# Patient Record
Sex: Female | Born: 1950
Health system: Southern US, Community
[De-identification: ages and names within clinical notes are randomized; demographics above are authoritative.]

## PROBLEM LIST (undated history)

## (undated) DIAGNOSIS — E119 Type 2 diabetes mellitus without complications: Secondary | ICD-10-CM

---

## 1998-06-05 ENCOUNTER — Emergency Department (HOSPITAL_COMMUNITY): Admission: EM | Admit: 1998-06-05 | Discharge: 1998-06-05 | Payer: Self-pay | Admitting: Emergency Medicine

## 1998-06-05 ENCOUNTER — Encounter: Payer: Self-pay | Admitting: Emergency Medicine

## 1998-06-06 ENCOUNTER — Encounter: Payer: Self-pay | Admitting: Emergency Medicine

## 1998-08-22 ENCOUNTER — Other Ambulatory Visit: Admission: RE | Admit: 1998-08-22 | Discharge: 1998-08-22 | Payer: Self-pay | Admitting: Obstetrics and Gynecology

## 1999-06-27 ENCOUNTER — Emergency Department (HOSPITAL_COMMUNITY): Admission: EM | Admit: 1999-06-27 | Discharge: 1999-06-27 | Payer: Self-pay

## 1999-10-03 ENCOUNTER — Other Ambulatory Visit: Admission: RE | Admit: 1999-10-03 | Discharge: 1999-10-03 | Payer: Self-pay | Admitting: Obstetrics and Gynecology

## 2000-12-29 ENCOUNTER — Other Ambulatory Visit: Admission: RE | Admit: 2000-12-29 | Discharge: 2000-12-29 | Payer: Self-pay | Admitting: Obstetrics and Gynecology

## 2001-11-10 ENCOUNTER — Other Ambulatory Visit: Admission: RE | Admit: 2001-11-10 | Discharge: 2001-11-10 | Payer: Self-pay | Admitting: Obstetrics and Gynecology

## 2001-11-19 ENCOUNTER — Encounter: Admission: RE | Admit: 2001-11-19 | Discharge: 2001-11-19 | Payer: Self-pay | Admitting: Obstetrics and Gynecology

## 2001-11-19 ENCOUNTER — Encounter: Payer: Self-pay | Admitting: Obstetrics and Gynecology

## 2002-05-07 ENCOUNTER — Inpatient Hospital Stay (HOSPITAL_COMMUNITY): Admission: AD | Admit: 2002-05-07 | Discharge: 2002-05-07 | Payer: Self-pay | Admitting: Obstetrics and Gynecology

## 2002-06-02 ENCOUNTER — Encounter: Admission: RE | Admit: 2002-06-02 | Discharge: 2002-08-31 | Payer: Self-pay | Admitting: Internal Medicine

## 2002-11-08 ENCOUNTER — Encounter: Admission: RE | Admit: 2002-11-08 | Discharge: 2002-11-08 | Payer: Self-pay | Admitting: Internal Medicine

## 2002-11-08 ENCOUNTER — Encounter: Payer: Self-pay | Admitting: Internal Medicine

## 2005-07-02 ENCOUNTER — Other Ambulatory Visit: Admission: RE | Admit: 2005-07-02 | Discharge: 2005-07-02 | Payer: Self-pay | Admitting: Obstetrics and Gynecology

## 2011-02-10 ENCOUNTER — Other Ambulatory Visit (HOSPITAL_COMMUNITY)
Admission: RE | Admit: 2011-02-10 | Discharge: 2011-02-10 | Disposition: A | Discharge status: Home / Self Care | Payer: Self-pay | Source: Ambulatory Visit | Attending: *Deleted | Admitting: *Deleted

## 2011-02-10 ENCOUNTER — Other Ambulatory Visit: Payer: Self-pay | Admitting: *Deleted

## 2011-02-10 DIAGNOSIS — Z01419 Encounter for gynecological examination (general) (routine) without abnormal findings: Secondary | ICD-10-CM | POA: Insufficient documentation

## 2011-02-10 DIAGNOSIS — Z1159 Encounter for screening for other viral diseases: Secondary | ICD-10-CM | POA: Insufficient documentation

## 2012-12-01 ENCOUNTER — Other Ambulatory Visit (HOSPITAL_COMMUNITY)
Admission: RE | Admit: 2012-12-01 | Discharge: 2012-12-01 | Disposition: A | Discharge status: Home / Self Care | Payer: BC Managed Care – PPO | Source: Ambulatory Visit | Attending: Obstetrics and Gynecology | Admitting: Obstetrics and Gynecology

## 2012-12-01 ENCOUNTER — Other Ambulatory Visit: Payer: Self-pay | Admitting: Obstetrics and Gynecology

## 2012-12-01 DIAGNOSIS — Z01419 Encounter for gynecological examination (general) (routine) without abnormal findings: Secondary | ICD-10-CM | POA: Insufficient documentation

## 2014-04-07 ENCOUNTER — Other Ambulatory Visit (HOSPITAL_COMMUNITY)
Admission: RE | Admit: 2014-04-07 | Discharge: 2014-04-07 | Disposition: A | Discharge status: Home / Self Care | Payer: BC Managed Care – PPO | Source: Ambulatory Visit | Attending: Obstetrics and Gynecology | Admitting: Obstetrics and Gynecology

## 2014-04-07 ENCOUNTER — Other Ambulatory Visit: Payer: Self-pay | Admitting: Obstetrics and Gynecology

## 2014-04-07 DIAGNOSIS — Z1151 Encounter for screening for human papillomavirus (HPV): Secondary | ICD-10-CM | POA: Insufficient documentation

## 2014-04-07 DIAGNOSIS — Z01419 Encounter for gynecological examination (general) (routine) without abnormal findings: Secondary | ICD-10-CM | POA: Insufficient documentation

## 2014-04-07 DIAGNOSIS — Z1231 Encounter for screening mammogram for malignant neoplasm of breast: Secondary | ICD-10-CM

## 2014-04-10 LAB — CYTOLOGY - PAP

## 2014-04-13 ENCOUNTER — Ambulatory Visit: Payer: BC Managed Care – PPO

## 2014-05-21 ENCOUNTER — Emergency Department (HOSPITAL_COMMUNITY): Payer: Medicare PPO

## 2014-05-21 ENCOUNTER — Emergency Department (HOSPITAL_COMMUNITY)
Admission: EM | Admit: 2014-05-21 | Discharge: 2014-05-21 | Disposition: A | Discharge status: Home / Self Care | Payer: Medicare PPO | Attending: Emergency Medicine | Admitting: Emergency Medicine

## 2014-05-21 ENCOUNTER — Encounter (HOSPITAL_COMMUNITY): Payer: Self-pay | Admitting: *Deleted

## 2014-05-21 DIAGNOSIS — Z79899 Other long term (current) drug therapy: Secondary | ICD-10-CM | POA: Diagnosis not present

## 2014-05-21 DIAGNOSIS — M546 Pain in thoracic spine: Secondary | ICD-10-CM | POA: Insufficient documentation

## 2014-05-21 DIAGNOSIS — E119 Type 2 diabetes mellitus without complications: Secondary | ICD-10-CM | POA: Diagnosis not present

## 2014-05-21 DIAGNOSIS — K5900 Constipation, unspecified: Secondary | ICD-10-CM | POA: Diagnosis not present

## 2014-05-21 DIAGNOSIS — Z7982 Long term (current) use of aspirin: Secondary | ICD-10-CM | POA: Diagnosis not present

## 2014-05-21 DIAGNOSIS — Z720 Tobacco use: Secondary | ICD-10-CM | POA: Insufficient documentation

## 2014-05-21 HISTORY — DX: Type 2 diabetes mellitus without complications: E11.9

## 2014-05-21 LAB — COMPREHENSIVE METABOLIC PANEL
ALBUMIN: 4.1 g/dL (ref 3.5–5.2)
ALK PHOS: 71 U/L (ref 39–117)
ALT: 26 U/L (ref 0–35)
ANION GAP: 8 (ref 5–15)
AST: 22 U/L (ref 0–37)
BILIRUBIN TOTAL: 0.6 mg/dL (ref 0.3–1.2)
BUN: 8 mg/dL (ref 6–23)
CO2: 26 mmol/L (ref 19–32)
Calcium: 9.6 mg/dL (ref 8.4–10.5)
Chloride: 109 mEq/L (ref 96–112)
Creatinine, Ser: 0.71 mg/dL (ref 0.50–1.10)
GFR, EST NON AFRICAN AMERICAN: 89 mL/min — AB (ref >90)
Glucose, Bld: 117 mg/dL — ABNORMAL HIGH (ref 70–99)
POTASSIUM: 4.5 mmol/L (ref 3.5–5.1)
Sodium: 143 mmol/L (ref 135–145)
TOTAL PROTEIN: 6.9 g/dL (ref 6.0–8.3)

## 2014-05-21 LAB — URINALYSIS, ROUTINE W REFLEX MICROSCOPIC
Bilirubin Urine: NEGATIVE
GLUCOSE, UA: NEGATIVE mg/dL
HGB URINE DIPSTICK: NEGATIVE
Ketones, ur: NEGATIVE mg/dL
LEUKOCYTES UA: NEGATIVE
Nitrite: NEGATIVE
PROTEIN: NEGATIVE mg/dL
SPECIFIC GRAVITY, URINE: 1.022 (ref 1.005–1.030)
Urobilinogen, UA: 1 mg/dL (ref 0.0–1.0)
pH: 6 (ref 5.0–8.0)

## 2014-05-21 LAB — I-STAT TROPONIN, ED: TROPONIN I, POC: 0 ng/mL (ref 0.00–0.08)

## 2014-05-21 LAB — LIPASE, BLOOD: Lipase: 23 U/L (ref 11–59)

## 2014-05-21 LAB — CBC WITH DIFFERENTIAL/PLATELET
BASOS ABS: 0 10*3/uL (ref 0.0–0.1)
BASOS PCT: 0 % (ref 0–1)
EOS ABS: 0.1 10*3/uL (ref 0.0–0.7)
EOS PCT: 1 % (ref 0–5)
HCT: 39 % (ref 36.0–46.0)
HEMOGLOBIN: 13 g/dL (ref 12.0–15.0)
LYMPHS ABS: 2 10*3/uL (ref 0.7–4.0)
Lymphocytes Relative: 22 % (ref 12–46)
MCH: 29.6 pg (ref 26.0–34.0)
MCHC: 33.3 g/dL (ref 30.0–36.0)
MCV: 88.8 fL (ref 78.0–100.0)
Monocytes Absolute: 0.4 10*3/uL (ref 0.1–1.0)
Monocytes Relative: 4 % (ref 3–12)
Neutro Abs: 6.6 10*3/uL (ref 1.7–7.7)
Neutrophils Relative %: 73 % (ref 43–77)
Platelets: 238 10*3/uL (ref 150–400)
RBC: 4.39 MIL/uL (ref 3.87–5.11)
RDW: 12.7 % (ref 11.5–15.5)
WBC: 9 10*3/uL (ref 4.0–10.5)

## 2014-05-21 MED ORDER — CYCLOBENZAPRINE HCL 5 MG PO TABS: 5.0000 mg | ORAL_TABLET | Freq: Three times a day (TID) | ORAL | Status: DC | PRN

## 2014-05-21 MED ORDER — CYCLOBENZAPRINE HCL 10 MG PO TABS
5.0000 mg | ORAL_TABLET | Freq: Once | ORAL | Status: AC
  Administered 2014-05-21: 5 mg via ORAL
  Filled 2014-05-21: qty 1

## 2014-05-21 MED ORDER — ACETAMINOPHEN 325 MG PO TABS
650.0000 mg | ORAL_TABLET | Freq: Once | ORAL | Status: AC
  Administered 2014-05-21: 650 mg via ORAL
  Filled 2014-05-21: qty 2

## 2014-05-21 NOTE — ED Notes (Signed)
Pt st's pain is mostly in right mid to lower back

## 2014-05-21 NOTE — ED Notes (Signed)
Pt reports RUQ pain that radiates around to her back, denies n/v or sob. Denies urinary symptoms, pain increases with movement.

## 2014-05-21 NOTE — ED Provider Notes (Signed)
CSN: [id number]     Arrival date & time 05/21/14  1444 History   First MD Initiated Contact with Patient 05/21/14 1625     Chief Complaint  Patient presents with  . Abdominal Pain  . Back Pain     (Consider location/radiation/quality/duration/timing/severity/associated sxs/prior Treatment) HPI  64 year old female presents with one week of intermittent right upper back pain. Occasionally the pain radiates around to her right upper quadrant. The pain seems to come and go without obvious etiology why. It does worsen when she twists her back or does sudden movements. Inspiration does not increase her pain. No shortness of breath, chest pain, or cough. No current abdominal pain. The patient has not had any dysuria or hematuria. She has not tried anything besides Gas-X for the pain. She has been moderately constipated, no diarrhea recently. The patient has never had pain like this before.  Past Medical History  Diagnosis Date  . Diabetes mellitus without complication    History reviewed. No pertinent past surgical history. History reviewed. No pertinent family history. History  Substance Use Topics  . Smoking status: Current Every Day Smoker    Types: Cigarettes  . Smokeless tobacco: Not on file  . Alcohol Use: No   OB History    No data available     Review of Systems  Constitutional: Negative for fever.  Respiratory: Negative for cough and shortness of breath.   Cardiovascular: Negative for chest pain.  Gastrointestinal: Positive for abdominal pain. Negative for nausea, vomiting and diarrhea.  Genitourinary: Negative for dysuria and hematuria.  Musculoskeletal: Positive for back pain.  Neurological: Negative for weakness and numbness.  All other systems reviewed and are negative.     Allergies  Review of patient's allergies indicates no known allergies.  Home Medications   Prior to Admission medications   Medication Sig Start Date End Date Taking? Authorizing Provider    aspirin EC 81 MG tablet Take 81 mg by mouth every 6 (six) hours as needed (pain).   Yes Historical Provider, MD  metFORMIN (GLUCOPHAGE) 850 MG tablet Take 850 mg by mouth daily.  05/15/14  Yes Historical Provider, MD  valACYclovir (VALTREX) 500 MG tablet Take 500 mg by mouth daily as needed (outbreaks).  04/03/14  Yes Historical Provider, MD   BP 139/63 mmHg  Pulse 62  Temp(Src) 98.1 F (36.7 C) (Oral)  Resp 18  Ht  (1.549 m)  Wt 140 lb (63.504 kg)  BMI 26.47 kg/m2  SpO2 99% Physical Exam  Constitutional: She is oriented to person, place, and time. She appears well-developed and well-nourished. No distress.  HENT:  Head: Normocephalic and atraumatic.  Right Ear: External ear normal.  Left Ear: External ear normal.  Nose: Nose normal.  Eyes: Right eye exhibits no discharge. Left eye exhibits no discharge.  Cardiovascular: Normal rate, regular rhythm and normal heart sounds.   Pulmonary/Chest: Effort normal and breath sounds normal.  Abdominal: Soft. There is no tenderness. There is no rigidity, no guarding and negative Murphy's sign.  Musculoskeletal:       Thoracic back: She exhibits tenderness (tenderness over right CVA).       Back:  Neurological: She is alert and oriented to person, place, and time. She exhibits normal muscle tone. Gait normal.  Equal strength in extremities  Skin: Skin is warm and dry. She is not diaphoretic.  Nursing note and vitals reviewed.   ED Course  Procedures (including critical care time) Labs Review Labs Reviewed  COMPREHENSIVE METABOLIC PANEL -  Abnormal; Notable for the following:    Glucose, Bld 117 (*)    GFR calc non Af Amer 89 (*)    All other components within normal limits  CBC WITH DIFFERENTIAL  LIPASE, BLOOD  URINALYSIS, ROUTINE W REFLEX MICROSCOPIC  I-STAT TROPOININ, ED    Imaging Review Ct Renal Stone Study  05/21/2014   CLINICAL DATA:  Right flank pain. Right upper quadrant abdominal pain. Clinical suspicion for renal  calculi. Diabetes.  EXAM: CT ABDOMEN AND PELVIS WITHOUT CONTRAST  TECHNIQUE: Multidetector CT imaging of the abdomen and pelvis was performed following the standard protocol without IV contrast.  COMPARISON:  None.  FINDINGS: Lower chest:  Mildly elevated left hemidiaphragm.  Hepatobiliary: Upper normal CBD size 7 mm.  Otherwise negative.  Pancreas: Unremarkable  Spleen: Unremarkable  Adrenals/Urinary Tract: Unremarkable  Stomach/Bowel: Borderline wall thickening in the appendix at 7 mm, without significant periappendiceal inflammatory findings. Scattered colonic diverticula.  Vascular/Lymphatic: Minimal atherosclerosis.  Reproductive: Unremarkable  Other: No supplemental non-categorized findings.  Musculoskeletal: Nonspecific 7 mm sclerotic lesion posteriorly in the L3 vertebral body. Left eccentric disc bulge at L4-5.  IMPRESSION: 1. A specific cause for right-sided abdominal pain is not identified. No stones noted. 2. Left eccentric disc bulge at L4-5.  Minimal atherosclerosis. 3. There is borderline appendiceal wall thickening with appendiceal diameter at 7 mm, but without periappendiceal stranding. The appearance is not considered diagnostic for acute appendicitis. 4. Mildly elevated left hemidiaphragm.   Electronically Signed   By: Herbie BaltimoreWalt  Liebkemann M.D.   On: 05/21/2014 17:07     EKG Interpretation   Date/Time:  Sunday May 21 2014 14:52:50 EST Ventricular Rate:  63 PR Interval:  150 QRS Duration: 76 QT Interval:  408 QTC Calculation: 417 R Axis:   62 Text Interpretation:  Normal sinus rhythm with sinus arrhythmia Normal ECG  No old tracing to compare Confirmed by Cecil Vandyke  MD, Ludivina Guymon ([id number]) on  05/21/2014 4:38:52 PM      MDM   Final diagnoses:  Right-sided thoracic back pain    No obvious etiology for the patient's right-sided back pain. It does appear to radiate to the front of her abdomen occasionally but she has no current abdominal pain, normal LFTs, and normal CT scan. At this  point given her point tenderness this is likely a skilled skeletal strain/pain. Will treat with Tylenol, NSAIDs, and Flexeril. She does not want any narcotics at this time. Discussed importance of follow-up with PCP if symptoms do not improve and we discussed strict return precautions.    Audree CamelScott T Dulcinea Kinser, MD 05/21/14 [phone]

## 2014-05-21 NOTE — ED Notes (Signed)
Pt returned from CT °

## 2014-05-21 NOTE — Discharge Instructions (Signed)

## 2015-02-09 IMAGING — CT CT RENAL STONE PROTOCOL
2 of 4 series · 5 of 46 positions shown, 7 images · non-contrast
Comparison: None.

CLINICAL DATA: Right flank pain. Right upper quadrant abdominal
pain. Clinical suspicion for renal calculi. Diabetes.

EXAM:
CT ABDOMEN AND PELVIS WITHOUT CONTRAST
TECHNIQUE: Multidetector CT imaging of the abdomen and pelvis was performed
following the standard protocol without IV contrast.

[Series 204: coronals · coronal · 0.50mm/px · 4 of 107 slices shown, 5 images]
[im 24/107  soft-tissue]
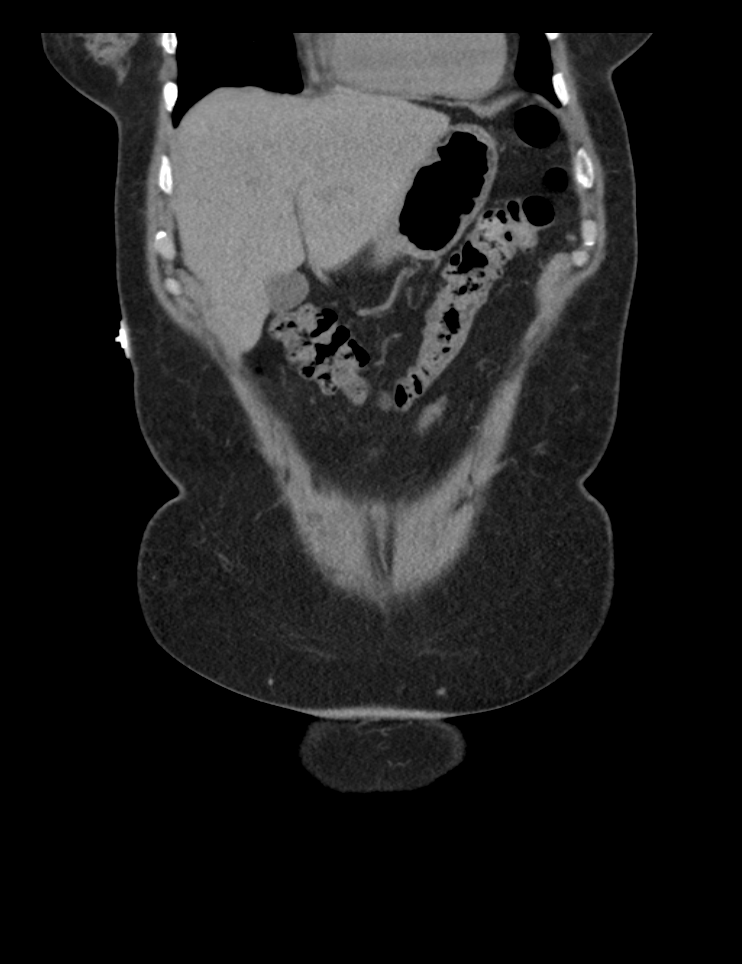
[im 24/107  bone]
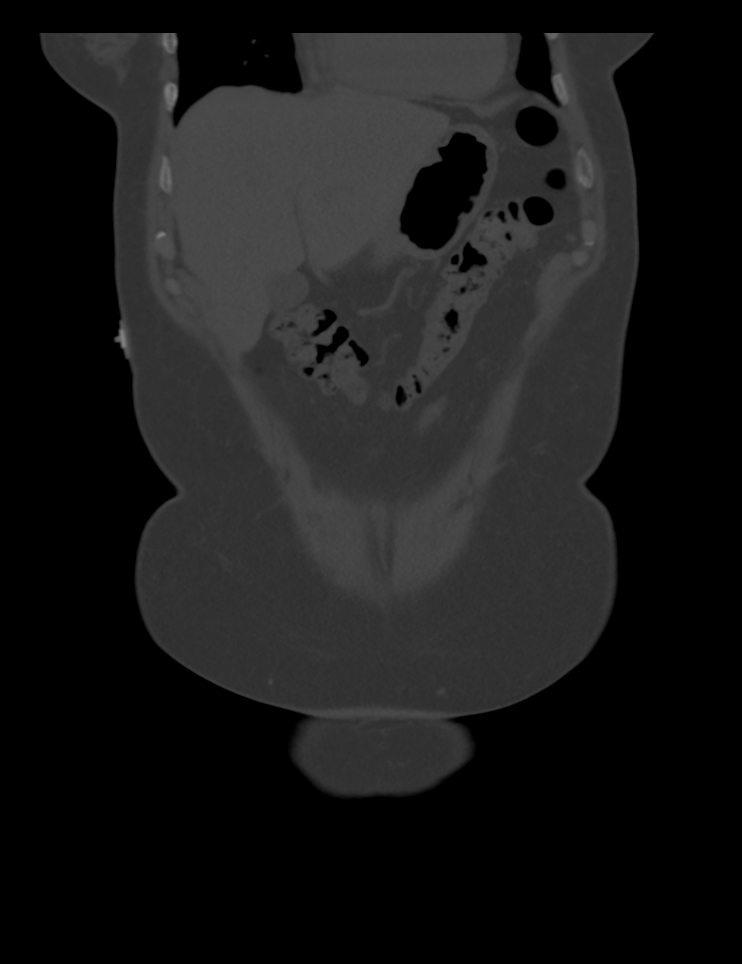
[im 48/107  soft-tissue]
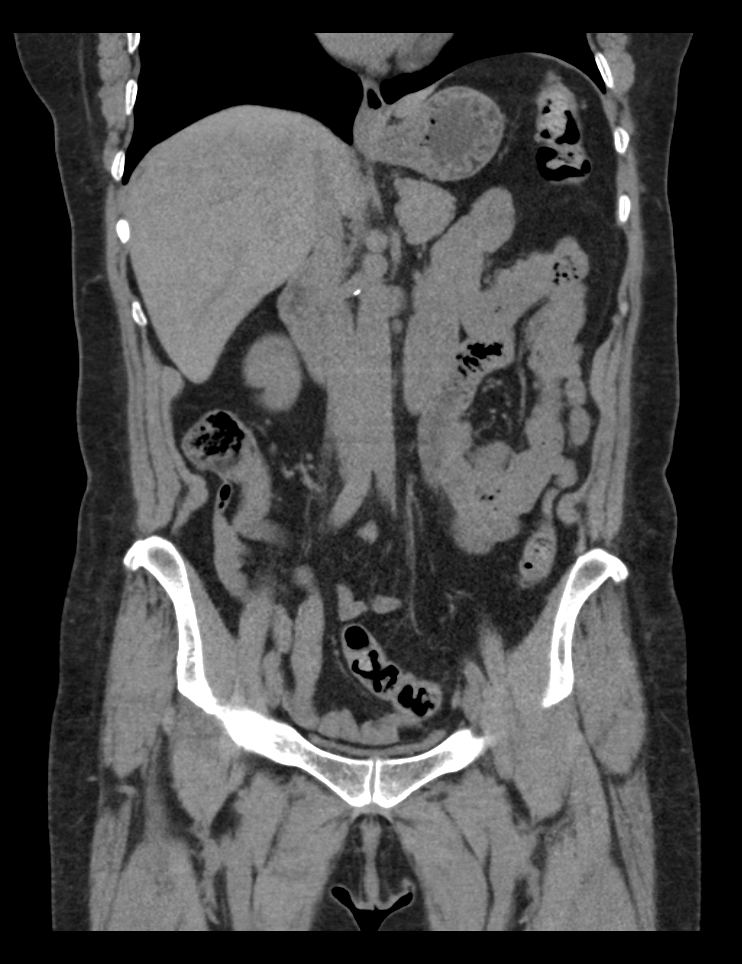
[im 71/107  soft-tissue]
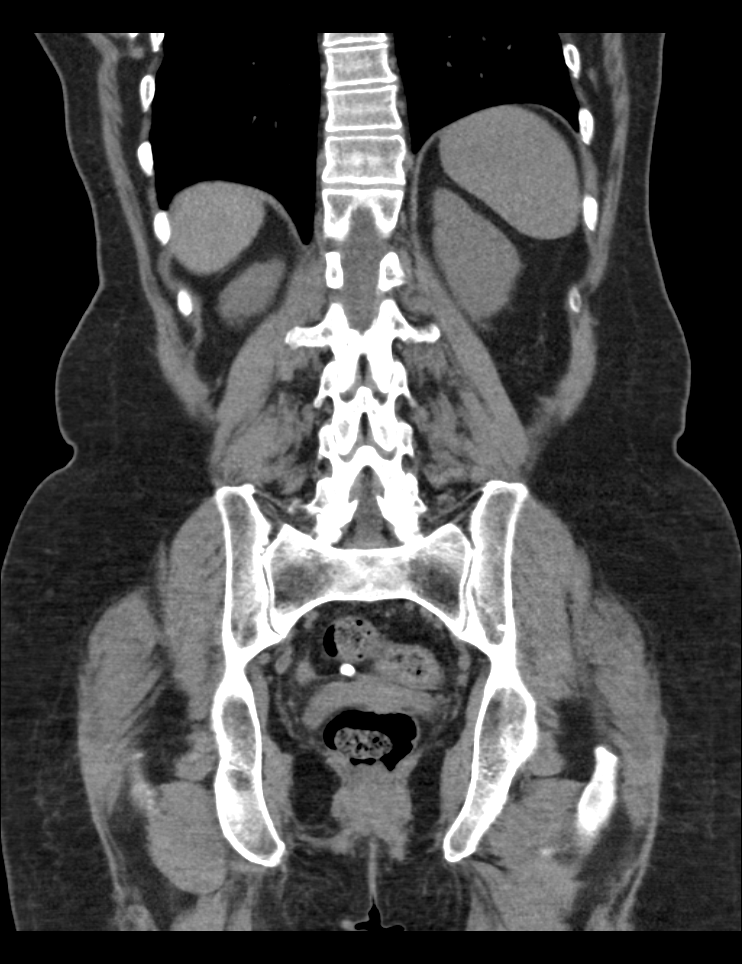
[im 95/107  soft-tissue]
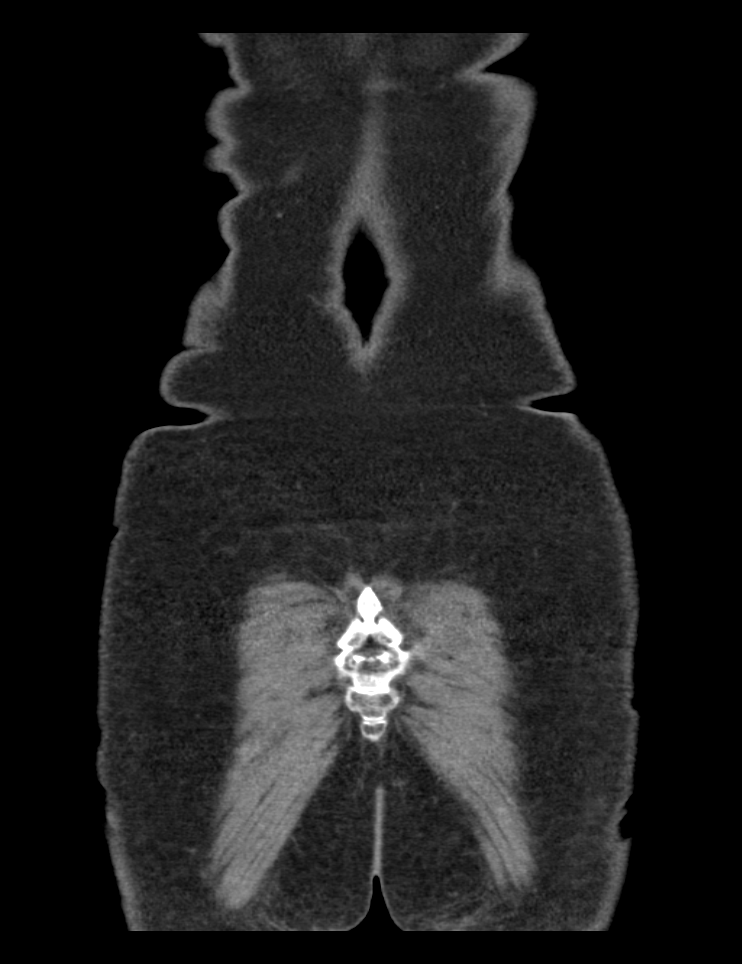

[Series 205: sagittals · sagittal · 0.50mm/px · 1 of 123 slices shown, 2 images]
[im 41/123  soft-tissue]
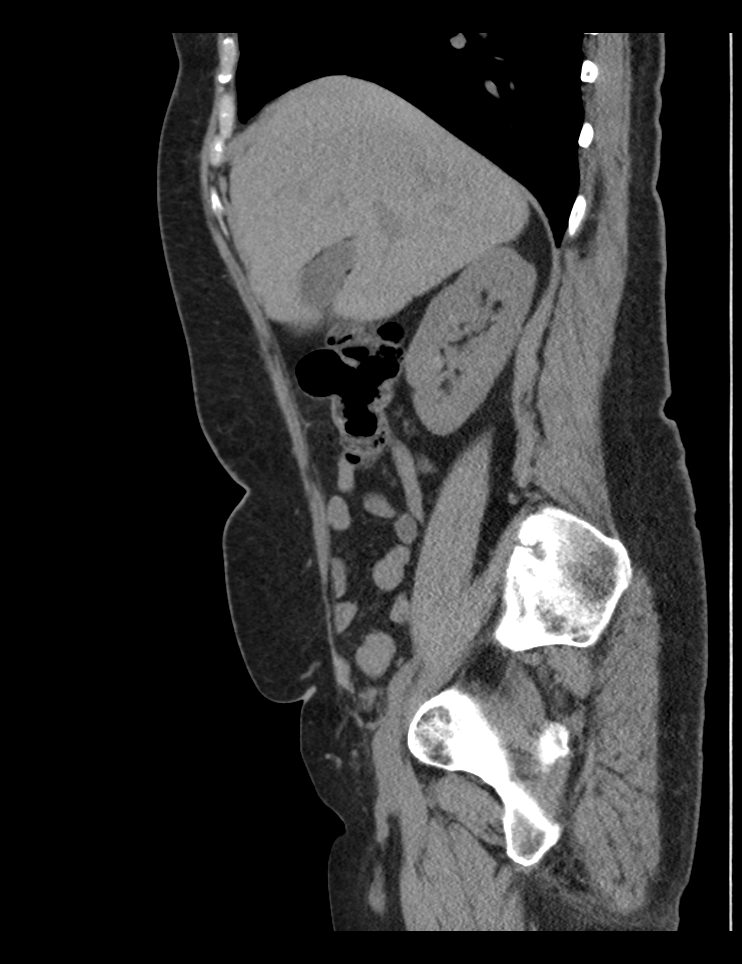
[im 41/123  bone]
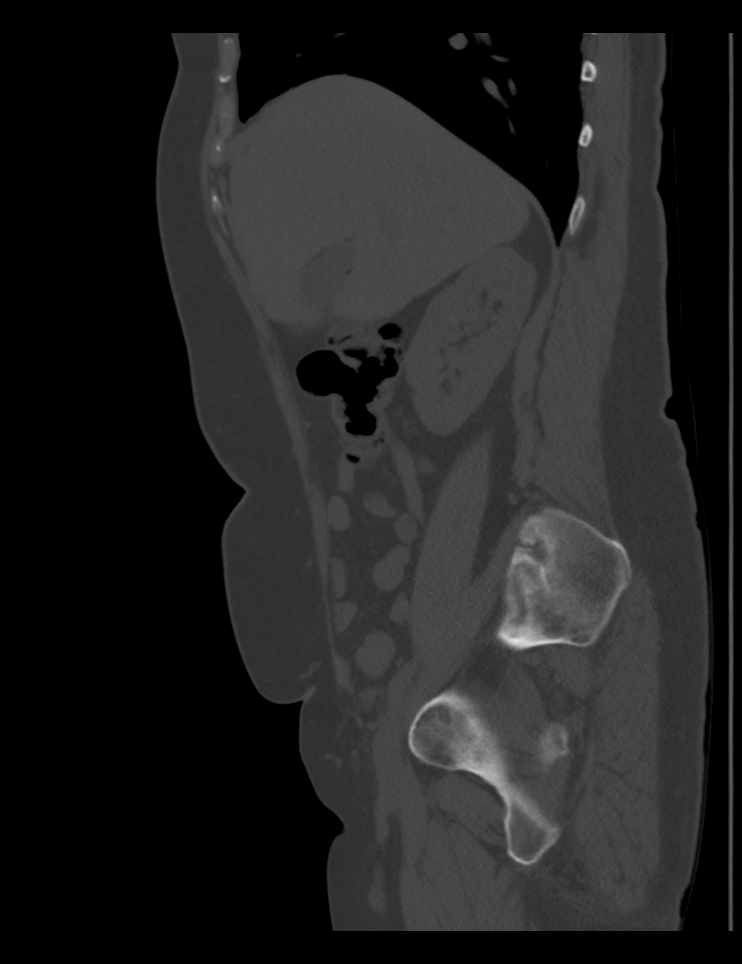

[5 of 46 positions shown; findings below may reference images not displayed]

FINDINGS: Lower chest:  Mildly elevated left hemidiaphragm.

Hepatobiliary: Upper normal CBD size 7 mm.  Otherwise negative.

Pancreas: Unremarkable

Spleen: Unremarkable

Adrenals/Urinary Tract: Unremarkable

Stomach/Bowel: Borderline wall thickening in the appendix at 7 mm,
without significant periappendiceal inflammatory findings. Scattered
colonic diverticula.

Vascular/Lymphatic: Minimal atherosclerosis.

Reproductive: Unremarkable

Other: No supplemental non-categorized findings.

Musculoskeletal: Nonspecific 7 mm sclerotic lesion posteriorly in
the L3 vertebral body. Left eccentric disc bulge at L4-5.
IMPRESSION: 1. A specific cause for right-sided abdominal pain is not
identified. No stones noted.
2. Left eccentric disc bulge at L4-5.  Minimal atherosclerosis.
3. There is borderline appendiceal wall thickening with appendiceal
diameter at 7 mm, but without periappendiceal stranding. The
appearance is not considered diagnostic for acute appendicitis.
4. Mildly elevated left hemidiaphragm.

## 2015-09-24 DIAGNOSIS — F17210 Nicotine dependence, cigarettes, uncomplicated: Secondary | ICD-10-CM | POA: Diagnosis not present

## 2015-09-24 DIAGNOSIS — E119 Type 2 diabetes mellitus without complications: Secondary | ICD-10-CM | POA: Diagnosis not present

## 2015-09-24 DIAGNOSIS — Z7984 Long term (current) use of oral hypoglycemic drugs: Secondary | ICD-10-CM | POA: Diagnosis not present

## 2015-09-24 DIAGNOSIS — E782 Mixed hyperlipidemia: Secondary | ICD-10-CM | POA: Diagnosis not present

## 2015-09-24 DIAGNOSIS — Z1211 Encounter for screening for malignant neoplasm of colon: Secondary | ICD-10-CM | POA: Diagnosis not present

## 2015-09-24 DIAGNOSIS — M549 Dorsalgia, unspecified: Secondary | ICD-10-CM | POA: Diagnosis not present

## 2015-10-17 ENCOUNTER — Other Ambulatory Visit: Payer: Self-pay | Admitting: Internal Medicine

## 2015-10-17 DIAGNOSIS — Z0000 Encounter for general adult medical examination without abnormal findings: Secondary | ICD-10-CM | POA: Diagnosis not present

## 2015-10-17 DIAGNOSIS — F17210 Nicotine dependence, cigarettes, uncomplicated: Secondary | ICD-10-CM | POA: Diagnosis not present

## 2015-10-17 DIAGNOSIS — Z7189 Other specified counseling: Secondary | ICD-10-CM | POA: Diagnosis not present

## 2015-10-17 DIAGNOSIS — Z13820 Encounter for screening for osteoporosis: Secondary | ICD-10-CM | POA: Diagnosis not present

## 2015-10-17 DIAGNOSIS — Z1231 Encounter for screening mammogram for malignant neoplasm of breast: Secondary | ICD-10-CM

## 2015-10-17 DIAGNOSIS — Z1389 Encounter for screening for other disorder: Secondary | ICD-10-CM | POA: Diagnosis not present

## 2015-10-17 DIAGNOSIS — Z716 Tobacco abuse counseling: Secondary | ICD-10-CM | POA: Diagnosis not present

## 2015-10-17 DIAGNOSIS — Z1239 Encounter for other screening for malignant neoplasm of breast: Secondary | ICD-10-CM | POA: Diagnosis not present

## 2015-10-17 DIAGNOSIS — Z23 Encounter for immunization: Secondary | ICD-10-CM | POA: Diagnosis not present

## 2015-11-22 ENCOUNTER — Ambulatory Visit
Admission: RE | Admit: 2015-11-22 | Discharge: 2015-11-22 | Disposition: A | Discharge status: Home / Self Care | Payer: BC Managed Care – PPO | Source: Ambulatory Visit | Attending: Internal Medicine | Admitting: Internal Medicine

## 2015-11-22 DIAGNOSIS — Z1231 Encounter for screening mammogram for malignant neoplasm of breast: Secondary | ICD-10-CM | POA: Diagnosis not present

## 2016-04-03 ENCOUNTER — Other Ambulatory Visit: Payer: Self-pay | Admitting: Internal Medicine

## 2016-04-03 ENCOUNTER — Ambulatory Visit
Admission: RE | Admit: 2016-04-03 | Discharge: 2016-04-03 | Disposition: A | Discharge status: Home / Self Care | Payer: BC Managed Care – PPO | Source: Ambulatory Visit | Attending: Internal Medicine | Admitting: Internal Medicine

## 2016-04-03 DIAGNOSIS — E119 Type 2 diabetes mellitus without complications: Secondary | ICD-10-CM | POA: Diagnosis not present

## 2016-04-03 DIAGNOSIS — Z23 Encounter for immunization: Secondary | ICD-10-CM | POA: Diagnosis not present

## 2016-04-03 DIAGNOSIS — Z7984 Long term (current) use of oral hypoglycemic drugs: Secondary | ICD-10-CM | POA: Diagnosis not present

## 2016-04-03 DIAGNOSIS — M549 Dorsalgia, unspecified: Secondary | ICD-10-CM

## 2016-04-03 DIAGNOSIS — R0781 Pleurodynia: Secondary | ICD-10-CM | POA: Diagnosis not present

## 2018-03-17 ENCOUNTER — Emergency Department (HOSPITAL_COMMUNITY)
Admission: EM | Admit: 2018-03-17 | Discharge: 2018-03-17 | Disposition: A | Discharge status: Home / Self Care | Payer: Medicare Other | Attending: Emergency Medicine | Admitting: Emergency Medicine

## 2018-03-17 DIAGNOSIS — E119 Type 2 diabetes mellitus without complications: Secondary | ICD-10-CM | POA: Diagnosis not present

## 2018-03-17 DIAGNOSIS — Y92410 Unspecified street and highway as the place of occurrence of the external cause: Secondary | ICD-10-CM | POA: Insufficient documentation

## 2018-03-17 DIAGNOSIS — Z7984 Long term (current) use of oral hypoglycemic drugs: Secondary | ICD-10-CM | POA: Diagnosis not present

## 2018-03-17 DIAGNOSIS — M7918 Myalgia, other site: Secondary | ICD-10-CM | POA: Insufficient documentation

## 2018-03-17 DIAGNOSIS — Z7982 Long term (current) use of aspirin: Secondary | ICD-10-CM | POA: Diagnosis not present

## 2018-03-17 DIAGNOSIS — F17210 Nicotine dependence, cigarettes, uncomplicated: Secondary | ICD-10-CM | POA: Insufficient documentation
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
## 2018-03-17 NOTE — ED Notes (Signed)
Patient verbalizes understanding of discharge instructions. Opportunity for questioning and answers were provided. Armband removed by staff, pt discharged from ED ambulatory.   

## 2019-07-15 ENCOUNTER — Ambulatory Visit: Payer: Medicare PPO | Attending: Internal Medicine

## 2019-07-15 DIAGNOSIS — Z23 Encounter for immunization: Secondary | ICD-10-CM

## 2019-07-15 NOTE — Progress Notes (Signed)
   Covid-19 Vaccination Clinic  Name:  Pamela Gonzalez    MRN: [id number] DOB: [birth year]  07/15/2019  [name] was observed post Covid-19 immunization for 15 minutes without incident. She was provided with Vaccine Information Sheet and instruction to access the V-Safe system.   [name] was instructed to call 911 with any severe reactions post vaccine: Marland Kitchen Difficulty breathing  . Swelling of face and throat  . A fast heartbeat  . A bad rash all over body  . Dizziness and weakness   Immunizations Administered    Name Date Dose VIS Date Route   Pfizer COVID-19 Vaccine 07/15/2019 11:02 AM 0.3 mL 04/15/2019 Intramuscular   Manufacturer: [employer], [employer]   Lot: [id number]   NDC: [address]-9

## 2019-07-18 ENCOUNTER — Other Ambulatory Visit: Payer: Self-pay | Admitting: Internal Medicine

## 2019-07-18 DIAGNOSIS — Z1231 Encounter for screening mammogram for malignant neoplasm of breast: Secondary | ICD-10-CM

## 2019-07-18 DIAGNOSIS — E2839 Other primary ovarian failure: Secondary | ICD-10-CM

## 2019-07-25 ENCOUNTER — Other Ambulatory Visit: Payer: Self-pay | Admitting: Internal Medicine

## 2019-07-25 DIAGNOSIS — F17210 Nicotine dependence, cigarettes, uncomplicated: Secondary | ICD-10-CM

## 2019-07-27 ENCOUNTER — Other Ambulatory Visit: Payer: Self-pay | Admitting: Internal Medicine

## 2019-07-27 DIAGNOSIS — F17210 Nicotine dependence, cigarettes, uncomplicated: Secondary | ICD-10-CM

## 2019-08-08 ENCOUNTER — Ambulatory Visit: Payer: Medicare PPO | Attending: Internal Medicine

## 2019-08-08 DIAGNOSIS — Z23 Encounter for immunization: Secondary | ICD-10-CM

## 2019-08-08 NOTE — Progress Notes (Signed)
   Covid-19 Vaccination Clinic  Name:  Pamela Gonzalez    MRN: [id number] DOB: Mar 08, 1951  08/08/2019  [name] was observed post Covid-19 immunization for 15 minutes without incident. She was provided with Vaccine Information Sheet and instruction to access the V-Safe system.   [name] was instructed to call 911 with any severe reactions post vaccine: Marland Kitchen Difficulty breathing  . Swelling of face and throat  . A fast heartbeat  . A bad rash all over body  . Dizziness and weakness   Immunizations Administered    Name Date Dose VIS Date Route   Pfizer COVID-19 Vaccine 08/08/2019 12:25 PM 0.3 mL 04/15/2019 Intramuscular   Manufacturer: [employer], [employer]   Lot: [id number]   NDC: [address]-9

## 2020-07-04 ENCOUNTER — Other Ambulatory Visit: Payer: Self-pay

## 2020-07-04 ENCOUNTER — Ambulatory Visit: Payer: Medicare PPO | Admitting: Podiatry

## 2020-07-04 DIAGNOSIS — M779 Enthesopathy, unspecified: Secondary | ICD-10-CM | POA: Diagnosis not present

## 2020-07-04 DIAGNOSIS — M2041 Other hammer toe(s) (acquired), right foot: Secondary | ICD-10-CM

## 2020-07-04 DIAGNOSIS — L84 Corns and callosities: Secondary | ICD-10-CM | POA: Diagnosis not present

## 2020-07-04 MED ORDER — TRIAMCINOLONE ACETONIDE 10 MG/ML IJ SUSP
10.0000 mg | Freq: Once | INTRAMUSCULAR | Status: AC
  Administered 2020-07-04: 10 mg

## 2020-07-09 NOTE — Progress Notes (Signed)
Subjective:   Patient ID: Pamela Gonzalez, female   DOB: 70 y.o.   MRN: [id number]   HPI Patient presents stating that she has developed lesions on the bottom of both feet and the fourth toe right has been very inflamed and hard for her to wear shoe gear with.  She feels like there is fluid in it and its been ongoing and she is tried wider shoes and soaks.  Patient does not currently smoke likes to be active if possible   Review of Systems  All other systems reviewed and are negative.       Objective:  Physical Exam Vitals and nursing note reviewed.  Constitutional:      Appearance: She is well-developed and well-nourished.  Cardiovascular:     Pulses: Intact distal pulses.  Pulmonary:     Effort: Pulmonary effort is normal.  Musculoskeletal:        General: Normal range of motion.  Skin:    General: Skin is warm.  Neurological:     Mental Status: She is alert.     Neurovascular status was found to be within normal limits with patient noted to have diminished range of motion subtalar midtarsal joint and diminished muscle strength bilateral.  Patient is found to have plantar keratotic lesion subone subfive metatarsal painful when pressed and is found to have inflammatory capsulitis of the fourth digit right inner phalangeal joint  and has inflammation of the fourth digit right that has fluid buildup in the inner phalangeal joint and is quite sore    Assessment:  Inflamed capsule fourth digit right with fluid buildup and lesion formation subone 7 5 both feet painful     Plan:  H&P all conditions reviewed and x-rays taken.  Today went ahead and I injected the digit for 3 mg dexamethasone with a small amount of Kenalog and I debrided lesions bilateral with no iatrogenic bleeding reappoint routine care may require more aggressive treatment plan with digital correction  X-rays indicated rotational component to the digits no other signs of pathology mild osteoporosis

## 2020-12-03 ENCOUNTER — Other Ambulatory Visit: Payer: Self-pay

## 2020-12-03 ENCOUNTER — Encounter: Payer: Self-pay | Admitting: Podiatry

## 2020-12-03 ENCOUNTER — Ambulatory Visit: Payer: Medicare PPO | Admitting: Podiatry

## 2020-12-03 DIAGNOSIS — L84 Corns and callosities: Secondary | ICD-10-CM | POA: Diagnosis not present

## 2020-12-03 NOTE — Progress Notes (Signed)
Subjective:   Patient ID: Pamela Gonzalez, female   DOB: 70 y.o.   MRN: [id number]   HPI Patient presents stating that these lesions are very tender on the bottom of both feet and the fifth digit   ROS      Objective:  Physical Exam  Neurovascular status intact with keratotic lesions of hallux bilateral and fifth digit right all painful when pressed     Assessment:  Chronic keratotic lesion bilateral     Plan:  Debridement painful lesions reappoint as needed

## 2023-09-08 ENCOUNTER — Encounter (HOSPITAL_COMMUNITY): Payer: Self-pay

## 2023-09-08 ENCOUNTER — Ambulatory Visit (HOSPITAL_COMMUNITY): Admission: EM | Admit: 2023-09-08 | Discharge: 2023-09-08 | Disposition: A | Discharge status: Home / Self Care

## 2023-09-08 DIAGNOSIS — W57XXXA Bitten or stung by nonvenomous insect and other nonvenomous arthropods, initial encounter: Secondary | ICD-10-CM | POA: Diagnosis not present

## 2023-09-08 DIAGNOSIS — S30861A Insect bite (nonvenomous) of abdominal wall, initial encounter: Secondary | ICD-10-CM

## 2023-09-08 LAB — POCT FASTING CBG KUC MANUAL ENTRY: POCT Glucose (KUC): 229 mg/dL — AB (ref 70–99)

## 2023-09-08 MED ORDER — MUPIROCIN 2 % EX OINT: 1.0000 | TOPICAL_OINTMENT | Freq: Every day | CUTANEOUS | 2 refills | Status: DC

## 2023-09-08 NOTE — Discharge Instructions (Addendum)
  1. Insect bite of abdominal wall, initial encounter (Primary) - mupirocin ointment (BACTROBAN) 2 %; Apply 1 Application topically daily.  Dispense: 44 g; Refill: 2 - Recheck vitals repeat blood pressure is 192/71, follow-up with primary care provider for evaluation of elevated blood pressure and possible need for antihypertensive medication. - POC CBG monitoring completed in UC shows a blood glucose of 229 mg/dL.  Follow-up with your primary care provider for ongoing management of type 2 diabetes. - If current symptoms worsen or you develop new symptoms follow-up in the ER for further evaluation and management.

## 2023-09-08 NOTE — ED Triage Notes (Signed)
 Pt c/o insect bite to RLQ on Friday. States put peroxide on it.

## 2023-09-08 NOTE — ED Provider Notes (Signed)
 [facility]  Note:  This document was prepared using Dragon voice recognition software and may include unintentional dictation errors.  MRN: [id number] DOB: 21-Jul-1950  Subjective:   Pamela Gonzalez is a 73 y.o. female presenting for insect bite to right lower abdomen x 4 to 5 days.  Patient reports that she thinks she might have been bit by a spider.  Patient denies any severe pain or itching.  No significant secondary symptoms such as redness, warmth, fever, drainage, increased pain.  Patient has history of diabetes and hypertension and her nephew would like her blood pressure rechecked as well as blood glucose testing.  Patient is currently not taking any medications for diabetes or hypertension.  No current facility-administered medications for this encounter.  Current Outpatient Medications:    mupirocin ointment (BACTROBAN) 2 %, Apply 1 Application topically daily., Disp: 44 g, Rfl: 2   aspirin EC 81 MG tablet, Take 81 mg by mouth every 6 (six) hours as needed (pain). (Patient not taking: Reported on 09/08/2023), Disp: , Rfl:    cyclobenzaprine  (FLEXERIL ) 5 MG tablet, Take 1 tablet (5 mg total) by mouth 3 (three) times daily as needed for muscle spasms. (Patient not taking: Reported on 09/08/2023), Disp: 20 tablet, Rfl: 0   metFORMIN (GLUCOPHAGE) 850 MG tablet, Take 850 mg by mouth daily.  (Patient not taking: Reported on 09/08/2023), Disp: , Rfl:    simvastatin (ZOCOR) 20 MG tablet, SMARTSIG:1 Tablet(s) By Mouth Every Evening (Patient not taking: Reported on 09/08/2023), Disp: , Rfl:    valACYclovir (VALTREX) 500 MG tablet, Take 500 mg by mouth daily as needed (outbreaks).  (Patient not taking: Reported on 09/08/2023), Disp: , Rfl: 1   No Known Allergies  Past Medical History:  Diagnosis Date   Diabetes mellitus without complication (HCC)      History reviewed. No pertinent surgical history.  History reviewed. No pertinent family history.  Social History   Tobacco Use    Smoking status: Every Day    Types: Cigarettes  Substance Use Topics   Alcohol use: No   Drug use: No    ROS Refer to HPI for ROS details.  Objective:   Vitals: BP (!) 193/65 (BP Location: Right Arm)   Pulse 71   Temp 98.2 F (36.8 C) (Oral)   Resp 18   SpO2 98%   Physical Exam Vitals and nursing note reviewed.  Constitutional:      General: She is not in acute distress.    Appearance: Normal appearance. She is well-developed. She is not ill-appearing or toxic-appearing.  HENT:     Head: Normocephalic and atraumatic.  Cardiovascular:     Rate and Rhythm: Normal rate.  Pulmonary:     Effort: Pulmonary effort is normal. No respiratory distress.  Abdominal:     Palpations: Abdomen is soft.     Tenderness: There is no abdominal tenderness. There is no right CVA tenderness, left CVA tenderness, guarding or rebound.  Skin:    General: Skin is warm and dry.     Findings: Wound (Mildly erythematous, possible insect bite to right lower abdomen, no secondary signs of infection mild surrounding erythema no swelling, no drainage.) present.  Neurological:     General: No focal deficit present.     Mental Status: She is alert and oriented to person, place, and time.  Psychiatric:        Mood and Affect: Mood normal.        Behavior: Behavior normal.  Procedures  Results for orders placed or performed during the hospital encounter of 09/08/23 (from the past 24 hours)  POC CBG monitoring     Status: Abnormal   Collection Time: 09/08/23  1:58 PM  Result Value Ref Range   POCT Glucose ([facility]) 229 (A) 70 - 99 mg/dL    No results found.   Assessment and Plan :     Discharge Instructions       1. Insect bite of abdominal wall, initial encounter (Primary) - mupirocin ointment (BACTROBAN) 2 %; Apply 1 Application topically daily.  Dispense: 44 g; Refill: 2 - Recheck vitals repeat blood pressure is 192/71, follow-up with primary care provider for evaluation of elevated  blood pressure and possible need for antihypertensive medication. - POC CBG monitoring completed in UC shows a blood glucose of 229 mg/dL.  Follow-up with your primary care provider for ongoing management of type 2 diabetes. - If current symptoms worsen or you develop new symptoms follow-up in the ER for further evaluation and management.      Pamela Gonzalez   Pamela Gonzalez, [place], [place] 09/08/23 1416

## 2023-10-05 ENCOUNTER — Encounter (HOSPITAL_COMMUNITY): Payer: Self-pay

## 2023-10-05 ENCOUNTER — Emergency Department (HOSPITAL_COMMUNITY)

## 2023-10-05 ENCOUNTER — Observation Stay (HOSPITAL_COMMUNITY)
Admission: EM | Admit: 2023-10-05 | Discharge: 2023-10-07 | Disposition: A | Discharge status: Home / Self Care | Attending: Internal Medicine | Admitting: Internal Medicine

## 2023-10-05 ENCOUNTER — Other Ambulatory Visit: Payer: Self-pay

## 2023-10-05 DIAGNOSIS — R29703 NIHSS score 3: Secondary | ICD-10-CM

## 2023-10-05 DIAGNOSIS — E876 Hypokalemia: Secondary | ICD-10-CM | POA: Diagnosis not present

## 2023-10-05 DIAGNOSIS — Z79899 Other long term (current) drug therapy: Secondary | ICD-10-CM | POA: Insufficient documentation

## 2023-10-05 DIAGNOSIS — R531 Weakness: Secondary | ICD-10-CM

## 2023-10-05 DIAGNOSIS — I10 Essential (primary) hypertension: Secondary | ICD-10-CM | POA: Insufficient documentation

## 2023-10-05 DIAGNOSIS — R4781 Slurred speech: Secondary | ICD-10-CM | POA: Diagnosis not present

## 2023-10-05 DIAGNOSIS — E1151 Type 2 diabetes mellitus with diabetic peripheral angiopathy without gangrene: Secondary | ICD-10-CM | POA: Diagnosis not present

## 2023-10-05 DIAGNOSIS — I6782 Cerebral ischemia: Secondary | ICD-10-CM | POA: Diagnosis not present

## 2023-10-05 DIAGNOSIS — E785 Hyperlipidemia, unspecified: Secondary | ICD-10-CM | POA: Insufficient documentation

## 2023-10-05 DIAGNOSIS — K029 Dental caries, unspecified: Secondary | ICD-10-CM | POA: Insufficient documentation

## 2023-10-05 DIAGNOSIS — R2689 Other abnormalities of gait and mobility: Secondary | ICD-10-CM | POA: Diagnosis not present

## 2023-10-05 DIAGNOSIS — F17200 Nicotine dependence, unspecified, uncomplicated: Secondary | ICD-10-CM

## 2023-10-05 DIAGNOSIS — Z7982 Long term (current) use of aspirin: Secondary | ICD-10-CM | POA: Diagnosis not present

## 2023-10-05 DIAGNOSIS — F17210 Nicotine dependence, cigarettes, uncomplicated: Secondary | ICD-10-CM | POA: Diagnosis not present

## 2023-10-05 DIAGNOSIS — I639 Cerebral infarction, unspecified: Secondary | ICD-10-CM | POA: Diagnosis not present

## 2023-10-05 DIAGNOSIS — I6381 Other cerebral infarction due to occlusion or stenosis of small artery: Secondary | ICD-10-CM | POA: Diagnosis not present

## 2023-10-05 DIAGNOSIS — Z7984 Long term (current) use of oral hypoglycemic drugs: Secondary | ICD-10-CM | POA: Insufficient documentation

## 2023-10-05 DIAGNOSIS — R2990 Unspecified symptoms and signs involving the nervous system: Secondary | ICD-10-CM | POA: Diagnosis not present

## 2023-10-05 DIAGNOSIS — M6281 Muscle weakness (generalized): Secondary | ICD-10-CM | POA: Insufficient documentation

## 2023-10-05 DIAGNOSIS — R2681 Unsteadiness on feet: Secondary | ICD-10-CM | POA: Diagnosis not present

## 2023-10-05 DIAGNOSIS — I6529 Occlusion and stenosis of unspecified carotid artery: Secondary | ICD-10-CM | POA: Diagnosis not present

## 2023-10-05 DIAGNOSIS — E1165 Type 2 diabetes mellitus with hyperglycemia: Secondary | ICD-10-CM | POA: Diagnosis not present

## 2023-10-05 DIAGNOSIS — D329 Benign neoplasm of meninges, unspecified: Secondary | ICD-10-CM | POA: Diagnosis not present

## 2023-10-05 DIAGNOSIS — G936 Cerebral edema: Secondary | ICD-10-CM | POA: Diagnosis not present

## 2023-10-05 DIAGNOSIS — R29810 Facial weakness: Secondary | ICD-10-CM | POA: Diagnosis not present

## 2023-10-05 DIAGNOSIS — G8190 Hemiplegia, unspecified affecting unspecified side: Secondary | ICD-10-CM | POA: Diagnosis not present

## 2023-10-05 DIAGNOSIS — I6389 Other cerebral infarction: Secondary | ICD-10-CM | POA: Diagnosis not present

## 2023-10-05 LAB — CBC WITH DIFFERENTIAL/PLATELET
Abs Immature Granulocytes: 0.04 10*3/uL (ref 0.00–0.07)
Basophils Absolute: 0.1 10*3/uL (ref 0.0–0.1)
Basophils Relative: 1 %
Eosinophils Absolute: 0.1 10*3/uL (ref 0.0–0.5)
Eosinophils Relative: 1 %
HCT: 37.2 % (ref 36.0–46.0)
Hemoglobin: 12.1 g/dL (ref 12.0–15.0)
Immature Granulocytes: 1 %
Lymphocytes Relative: 19 %
Lymphs Abs: 1.6 10*3/uL (ref 0.7–4.0)
MCH: 29.2 pg (ref 26.0–34.0)
MCHC: 32.5 g/dL (ref 30.0–36.0)
MCV: 89.9 fL (ref 80.0–100.0)
Monocytes Absolute: 0.5 10*3/uL (ref 0.1–1.0)
Monocytes Relative: 5 %
Neutro Abs: 6 10*3/uL (ref 1.7–7.7)
Neutrophils Relative %: 73 %
Platelets: 196 10*3/uL (ref 150–400)
RBC: 4.14 MIL/uL (ref 3.87–5.11)
RDW: 12.1 % (ref 11.5–15.5)
WBC: 8.3 10*3/uL (ref 4.0–10.5)
nRBC: 0 % (ref 0.0–0.2)

## 2023-10-05 LAB — I-STAT CHEM 8, ED
BUN: 11 mg/dL (ref 8–23)
Calcium, Ion: 1.21 mmol/L (ref 1.15–1.40)
Chloride: 108 mmol/L (ref 98–111)
Creatinine, Ser: 0.7 mg/dL (ref 0.44–1.00)
Glucose, Bld: 123 mg/dL — ABNORMAL HIGH (ref 70–99)
HCT: 36 % (ref 36.0–46.0)
Hemoglobin: 12.2 g/dL (ref 12.0–15.0)
Potassium: 3.6 mmol/L (ref 3.5–5.1)
Sodium: 140 mmol/L (ref 135–145)
TCO2: 23 mmol/L (ref 22–32)

## 2023-10-05 LAB — COMPREHENSIVE METABOLIC PANEL WITH GFR
ALT: 24 U/L (ref 0–44)
AST: 17 U/L (ref 15–41)
Albumin: 3.7 g/dL (ref 3.5–5.0)
Alkaline Phosphatase: 56 U/L (ref 38–126)
Anion gap: 9 (ref 5–15)
BUN: 8 mg/dL (ref 8–23)
CO2: 22 mmol/L (ref 22–32)
Calcium: 9 mg/dL (ref 8.9–10.3)
Chloride: 108 mmol/L (ref 98–111)
Creatinine, Ser: 0.68 mg/dL (ref 0.44–1.00)
GFR, Estimated: >60 mL/min (ref >60)
Glucose, Bld: 120 mg/dL — ABNORMAL HIGH (ref 70–99)
Potassium: 3.7 mmol/L (ref 3.5–5.1)
Sodium: 139 mmol/L (ref 135–145)
Total Bilirubin: 0.8 mg/dL (ref 0.0–1.2)
Total Protein: 6.2 g/dL — ABNORMAL LOW (ref 6.5–8.1)

## 2023-10-05 LAB — RAPID URINE DRUG SCREEN, HOSP PERFORMED
Amphetamines: NOT DETECTED
Barbiturates: NOT DETECTED
Benzodiazepines: NOT DETECTED
Cocaine: NOT DETECTED
Opiates: NOT DETECTED
Tetrahydrocannabinol: NOT DETECTED

## 2023-10-05 LAB — CBG MONITORING, ED: Glucose-Capillary: 121 mg/dL — ABNORMAL HIGH (ref 70–99)

## 2023-10-05 LAB — PROTIME-INR
INR: 1 (ref 0.8–1.2)
Prothrombin Time: 13.1 s (ref 11.4–15.2)

## 2023-10-05 LAB — HEMOGLOBIN A1C
Hgb A1c MFr Bld: 7.1 % — ABNORMAL HIGH (ref 4.8–5.6)
Mean Plasma Glucose: 157.07 mg/dL

## 2023-10-05 LAB — APTT: aPTT: 24 s (ref 24–36)

## 2023-10-05 LAB — ETHANOL: Alcohol, Ethyl (B): <15 mg/dL (ref <15)

## 2023-10-05 MED ORDER — LABETALOL HCL 5 MG/ML IV SOLN: 5.0000 mg | Freq: Once | INTRAVENOUS | Status: DC

## 2023-10-05 MED ORDER — ACETAMINOPHEN 325 MG PO TABS: 650.0000 mg | ORAL_TABLET | Freq: Four times a day (QID) | ORAL | Status: DC | PRN

## 2023-10-05 MED ORDER — POLYETHYLENE GLYCOL 3350 17 G PO PACK: 17.0000 g | PACK | Freq: Every day | ORAL | Status: DC | PRN

## 2023-10-05 MED ORDER — ENOXAPARIN SODIUM 40 MG/0.4ML IJ SOSY
40.0000 mg | PREFILLED_SYRINGE | INTRAMUSCULAR | Status: DC
  Administered 2023-10-06: 40 mg via SUBCUTANEOUS
  Filled 2023-10-05: qty 0.4

## 2023-10-05 MED ORDER — HYDRALAZINE HCL 20 MG/ML IJ SOLN
5.0000 mg | Freq: Once | INTRAMUSCULAR | Status: AC
  Administered 2023-10-05: 5 mg via INTRAVENOUS
  Filled 2023-10-05: qty 1

## 2023-10-05 MED ORDER — LACTATED RINGERS IV SOLN: INTRAVENOUS | Status: DC

## 2023-10-05 MED ORDER — IOHEXOL 350 MG/ML SOLN
75.0000 mL | Freq: Once | INTRAVENOUS | Status: AC | PRN
  Administered 2023-10-05: 75 mL via INTRAVENOUS

## 2023-10-05 MED ORDER — PROCHLORPERAZINE EDISYLATE 10 MG/2ML IJ SOLN: 5.0000 mg | Freq: Four times a day (QID) | INTRAMUSCULAR | Status: DC | PRN

## 2023-10-05 MED ORDER — GADOBUTROL 1 MMOL/ML IV SOLN
6.0000 mL | Freq: Once | INTRAVENOUS | Status: AC | PRN
  Administered 2023-10-05: 6 mL via INTRAVENOUS

## 2023-10-05 MED ORDER — STROKE: EARLY STAGES OF RECOVERY BOOK
Freq: Once | Status: AC
  Filled 2023-10-05: qty 1

## 2023-10-05 MED ORDER — MELATONIN 5 MG PO TABS: 5.0000 mg | ORAL_TABLET | Freq: Every evening | ORAL | Status: DC | PRN

## 2023-10-05 NOTE — ED Triage Notes (Addendum)
 Pt to ED via [facility] from home c/o stroke like symptoms. Last known well 2200 yesterday. Pt sister was concerned and called EMS for Left arm weakness, left leg weakness. , and left sided facial droop.  #20LFA- No medications given by EMS.   Last VS: 140/78, hr 60, cbg 114, 99%RA.    A&O , per sister pt cognition is at baseline. Ambulatory

## 2023-10-05 NOTE — ED Notes (Signed)
 hold hydralazine for now per attending Schlossman MD,  Give when BP are consistently systolic > 200.

## 2023-10-05 NOTE — H&P (Addendum)
 History and Physical  LIYANNA CARTWRIGHT Gonzalez:[id number] DOB: 02-28-1951 DOA: 10/05/2023  Referring physician: Dr. Tamela Fake, EDP PCP: Jearldine Mina, MD  Outpatient Specialists: None. Patient coming from: Home.  Chief Complaint: Left-sided weakness.  HPI: Pamela Gonzalez is a 73 y.o. female with medical history significant for current tobacco user, 2 packs/day, type 2 diabetes, hypertension, not on any prescribed medications, who presents to the ER from home via EMS due to left arm weakness and left leg weakness.  Last known well was on 10/04/2023 around 2200.  Has not seen a medical provider in years.  She was having difficulty walking up the stairs and decided to come to the ER to be evaluated.  In the ER, the patient was noted to be severely hypertensive with SBP greater than 220 and DBP greater than 120.  CT angio head and neck was negative for LVO.  It showed remote lacunar infarct in the left caudate head.  Small meningioma over the parasagittal left frontal lobe.  Brain MRI with and without contrast revealed acute or early subacute infarct in the right pons.  Seen by neurology/stroke team who recommended admission for complete stroke workup.  At the time of this visit, the patient is alert and oriented x 3.  Her sister is present at bedside.  Residual left lower extremity weakness noted on exam.  ED Course: Temperature 97.3.  BP 193/65, pulse 66, respiratory rate 22, O2 saturation 100% on room air.  Lab studies notable for serum potassium 3.3.  Glucose 148.  Phosphorus 2.3.  CBC unremarkable.  Review of Systems: Review of systems as noted in the HPI. All other systems reviewed and are negative.   Past Medical History:  Diagnosis Date   Diabetes mellitus without complication (HCC)    History reviewed. No pertinent surgical history.  Social History:  reports that she has been smoking cigarettes. She does not have any smokeless tobacco history on file. She reports that she does not  drink alcohol and does not use drugs.   No Known Allergies  Family history: Mother with history of heart disease in her 46s.  Prior to Admission medications   Medication Sig Start Date End Date Taking? Authorizing Provider  aspirin EC 81 MG tablet Take 81 mg by mouth every 6 (six) hours as needed (pain). Patient not taking: Reported on 09/08/2023    [provider]  cyclobenzaprine  (FLEXERIL ) 5 MG tablet Take 1 tablet (5 mg total) by mouth 3 (three) times daily as needed for muscle spasms. Patient not taking: Reported on 09/08/2023 05/21/14   Jerilynn Montenegro, MD  metFORMIN (GLUCOPHAGE) 850 MG tablet Take 850 mg by mouth daily.  Patient not taking: Reported on 09/08/2023 05/15/14   [provider]  mupirocin  ointment (BACTROBAN ) 2 % Apply 1 Application topically daily. 09/08/23   Reddick, Johnathan B, NP  simvastatin (ZOCOR) 20 MG tablet SMARTSIG:1 Tablet(s) By Mouth Every Evening Patient not taking: Reported on 09/08/2023 06/26/20   [provider]  valACYclovir (VALTREX) 500 MG tablet Take 500 mg by mouth daily as needed (outbreaks).  Patient not taking: Reported on 09/08/2023 04/03/14   [provider]    Physical Exam: BP (!) 222/62   Pulse (!) 47   Temp 98.3 F (36.8 C) (Oral)   Resp 17   SpO2 100%   General: 73 y.o. year-old female well developed well nourished in no acute distress.  Alert and oriented x3. Cardiovascular: Regular rate and rhythm with no rubs or gallops.  No thyromegaly  or JVD noted.  No lower extremity edema. 2/4 pulses in all 4 extremities. Respiratory: Clear to auscultation with no wheezes or rales. Good inspiratory effort. Abdomen: Soft nontender nondistended with normal bowel sounds x4 quadrants. Muskuloskeletal: No cyanosis, clubbing or edema noted bilaterally Neuro: CN II-XII intact, 4/5 strength left lower extremity, sensation, reflexes Skin: No ulcerative lesions noted or rashes Psychiatry: Judgement and insight appear normal.  Mood is appropriate for condition and setting          Labs on Admission:  Basic Metabolic Panel: Recent Labs  Lab 10/05/23 1726 10/05/23 1745  NA 139 140  K 3.7 3.6  CL 108 108  CO2 22  --   GLUCOSE 120* 123*  BUN 8 11  CREATININE 0.68 0.70  CALCIUM 9.0  --    Liver Function Tests: Recent Labs  Lab 10/05/23 1726  AST 17  ALT 24  ALKPHOS 56  BILITOT 0.8  PROT 6.2*  ALBUMIN 3.7   No results for input(s): "LIPASE", "AMYLASE" in the last 168 hours. No results for input(s): "AMMONIA" in the last 168 hours. CBC: Recent Labs  Lab 10/05/23 1726 10/05/23 1745  WBC 8.3  --   NEUTROABS 6.0  --   HGB 12.1 12.2  HCT 37.2 36.0  MCV 89.9  --   PLT 196  --    Cardiac Enzymes: No results for input(s): "CKTOTAL", "CKMB", "CKMBINDEX", "TROPONINI" in the last 168 hours.  BNP (last 3 results) No results for input(s): "BNP" in the last 8760 hours.  ProBNP (last 3 results) No results for input(s): "PROBNP" in the last 8760 hours.  CBG: Recent Labs  Lab 10/05/23 1714  GLUCAP 121*    Radiological Exams on Admission: CT ANGIO HEAD NECK W WO CM Result Date: 10/05/2023 CLINICAL DATA:  Stroke/TIA, determine embolic source. EXAM: CT ANGIOGRAPHY HEAD AND NECK WITH AND WITHOUT CONTRAST TECHNIQUE: Multidetector CT imaging of the head and neck was performed using the standard protocol during bolus administration of intravenous contrast. Multiplanar CT image reconstructions and MIPs were obtained to evaluate the vascular anatomy. Carotid stenosis measurements (when applicable) are obtained utilizing NASCET criteria, using the distal internal carotid diameter as the denominator. RADIATION DOSE REDUCTION: This exam was performed according to the departmental dose-optimization program which includes automated exposure control, adjustment of the mA and/or kV according to patient size and/or use of iterative reconstruction technique. CONTRAST:  75mL OMNIPAQUE IOHEXOL 350 MG/ML SOLN COMPARISON:   None Available. FINDINGS: CT HEAD FINDINGS Brain: No acute intracranial hemorrhage. No CT evidence of acute infarct. Nonspecific hypoattenuation in the periventricular and subcortical white matter favored to reflect chronic microvascular ischemic changes. Remote lacunar infarct involving the left caudate head. 0.5 cm partially calcified focus overlying the parasagittal left frontal lobe near the vertex suggestive of small meningioma. No edema, mass effect, or midline shift. The basilar cisterns are patent. Ventricles: Ventricles are normal in size and configuration. Vascular: No hyperdense vessel. Intracranial atherosclerotic calcifications noted. Skull: No acute or aggressive finding. Sinuses/orbits: The visualized paranasal sinuses are clear. Orbits are symmetric. Other: Mastoid air cells are clear. CTA NECK FINDINGS Aortic arch: Common origin of the brachiocephalic and left common carotid arteries. Imaged portion shows no evidence of aneurysm or dissection. No significant stenosis of the major arch vessel origins. Pulmonary arteries: As permitted by contrast timing, there are no filling defects in the visualized pulmonary arteries. Subclavian arteries: The subclavian arteries are patent bilaterally. Right carotid system: No evidence of dissection, stenosis (50% or greater), or occlusion. Left  carotid system: No evidence of dissection, stenosis (50% or greater), or occlusion. Vertebral arteries: Codominant. No evidence of dissection, stenosis (50% or greater), or occlusion. Skeleton: No acute findings. Degenerative changes in the cervical spine. Small cervical ribs bilaterally at C7. Dental caries. Other neck: The visualized airway is patent. No cervical lymphadenopathy. Upper chest: Visualized lung apices are clear. Review of the MIP images confirms the above findings CTA HEAD FINDINGS ANTERIOR CIRCULATION: The intracranial internal carotid arteries are patent. Mild atherosclerosis of the carotid siphons without  high-grade stenosis. MCAs: The middle cerebral arteries are patent bilaterally. ACAs: The anterior cerebral arteries are patent bilaterally. POSTERIOR CIRCULATION: No significant stenosis, proximal occlusion, aneurysm, or vascular malformation. PCAs: The posterior cerebral arteries are patent bilaterally. Pcomm: Not well visualized. SCAs: The superior cerebellar arteries are patent bilaterally. Basilar artery: Patent AICAs: Not well visualized. PICAs: Patent Vertebral arteries: The intracranial vertebral arteries are patent. Venous sinuses: As permitted by contrast timing, patent. Anatomic variants: None Review of the MIP images confirms the above findings IMPRESSION: No large vessel occlusion. No high-grade stenosis, aneurysm, or dissection of the arteries in the head and neck. No CT evidence of acute intracranial abnormality. Remote lacunar infarct in the left caudate head. Small meningioma over the parasagittal left frontal lobe. Electronically Signed   By: Denny Flack M.D.   On: 10/05/2023 21:36    EKG: I independently viewed the EKG done and my findings are as followed: Sinus bradycardia rate of 54.  Nonspecific ST-T changes.  QTc 435.  Assessment/Plan Present on Admission:  Stroke-like symptoms  Principal Problem:   Stroke-like symptoms  Acute or early subacute infarct in the right pons, POA Last known well 10/04/2023 around 2200, outside of window for TNK  Ongoing permissive hypertension Treat SBP greater than 220 or DBP greater than 120 IV hydralazine due to bradycardia as needed with parameters Frequent neurochecks CTA head and neck no LVO, brain MRI with findings as stated above DAPT aspirin and Plavix x 21 days then aspirin alone High intensity statin PT/OT/speech therapy evaluation Follow-up 2D echo, fasting lipid panel, and hemoglobin A1c Fall and aspiration precautions  Hypertension, BP is not at goal Ongoing permissive hypertension at this time Gradual normalization of BP  within the next 2 days Closely monitor vital signs.  Hyperglycemia Goal hemoglobin A1c less than 7.0. Follow hemoglobin A1c  Hyperlipidemia Goal LDL less than 70 Follow fasting lipid panel  Tobacco abuse Smokes 2 packs/day Counseled on the importance of tobacco cessation, she was receptive  Hypokalemia and hypophosphatemia Repleted orally and intravenously respectively Magnesium 2.0 Replete electrolytes when indicated   Critical care time: 55 minutes.   DVT prophylaxis: Subcu Lovenox daily.  Code Status: Full code.  Family Communication: Updated the patient's sister at bedside.  Disposition Plan: Admitted to telemetry medical unit.  Consults called: Neurology.  Admission status: Inpatient status.   Status is: Inpatient The patient requires at least 2 midnights for further evaluation and treatment of present condition.   Bary Boss MD [facility] Pager [phone]  If 7PM-7AM, please contact night-coverage [email or web address] Password [facility]  10/05/2023, 10:36 PM

## 2023-10-05 NOTE — ED Notes (Signed)
 Attending Schlossman MD aware of pt BP

## 2023-10-05 NOTE — ED Notes (Signed)
 Pt to MRI

## 2023-10-05 NOTE — Consult Note (Signed)
 NEUROLOGY CONSULT NOTE   Date of service: October 05, 2023 Patient Name: Pamela Gonzalez MRN:  [id number] DOB:  May 05, 1951 Chief Complaint: "brainstem stroke" Requesting Provider: Scarlette Currier, MD  History of Present Illness  Pamela Gonzalez is a 73 y.o. female with hx of DM2 who was last seen at her baseline at 2200 and woke up with L arm and L leg weakness and L facial droop. Sister saw her today in the morning and noted facial droop and encouraged her to go to the ED. She was brought in to ED in the afternoon by EMS. She had CT Head w/o contrast which was negative for any acute abnormalities. MRI Brain demonstrates a R pontine stroke.  She smokes a pack a day, has not taken meds in 4-5 years. Has not seen a doctor in 4-5 years.  LKW: 2200 on 10/04/23. Modified rankin score: 0-Completely asymptomatic and back to baseline post- stroke IV Thrombolysis: not offered, outside window EVT: not offered, low suspicion for LVO  NIHSS components Score: Comment  1a Level of Conscious 0[x]  1[]  2[]  3[]      1b LOC Questions 0[x]  1[]  2[]       1c LOC Commands 0[x]  1[]  2[]       2 Best Gaze 0[x]  1[]  2[]       3 Visual 0[x]  1[]  2[]  3[]      4 Facial Palsy 0[]  1[x]  2[]  3[]      5a Motor Arm - left 0[x]  1[]  2[]  3[]  4[]  UN[]    5b Motor Arm - Right 0[x]  1[]  2[]  3[]  4[]  UN[]    6a Motor Leg - Left 0[x]  1[]  2[]  3[]  4[]  UN[]    6b Motor Leg - Right 0[x]  1[]  2[]  3[]  4[]  UN[]    7 Limb Ataxia 0[x]  1[]  2[]  UN[]      8 Sensory 0[]  1[x]  2[]  UN[]      9 Best Language 0[x]  1[]  2[]  3[]      10 Dysarthria 0[]  1[x]  2[]  UN[]      11 Extinct. and Inattention 0[x]  1[]  2[]       TOTAL: 3      ROS  Comprehensive ROS performed and pertinent positives documented in HPI   Past History   Past Medical History:  Diagnosis Date   Diabetes mellitus without complication (HCC)     History reviewed. No pertinent surgical history.  Family History: History reviewed. No pertinent family history.  Social History  reports that  she has been smoking cigarettes. She does not have any smokeless tobacco history on file. She reports that she does not drink alcohol and does not use drugs.  No Known Allergies  Medications   Current Facility-Administered Medications:    hydrALAZINE (APRESOLINE) injection 5 mg, 5 mg, Intravenous, Once, Scarlette Currier, MD  Current Outpatient Medications:    aspirin EC 81 MG tablet, Take 81 mg by mouth every 6 (six) hours as needed (pain). (Patient not taking: Reported on 09/08/2023), Disp: , Rfl:    cyclobenzaprine  (FLEXERIL ) 5 MG tablet, Take 1 tablet (5 mg total) by mouth 3 (three) times daily as needed for muscle spasms. (Patient not taking: Reported on 09/08/2023), Disp: 20 tablet, Rfl: 0   metFORMIN (GLUCOPHAGE) 850 MG tablet, Take 850 mg by mouth daily.  (Patient not taking: Reported on 09/08/2023), Disp: , Rfl:    mupirocin  ointment (BACTROBAN ) 2 %, Apply 1 Application topically daily., Disp: 44 g, Rfl: 2   simvastatin (ZOCOR) 20 MG tablet, SMARTSIG:1 Tablet(s) By Mouth Every Evening (Patient not taking: Reported on 09/08/2023), Disp: , Rfl:  valACYclovir (VALTREX) 500 MG tablet, Take 500 mg by mouth daily as needed (outbreaks).  (Patient not taking: Reported on 09/08/2023), Disp: , Rfl: 1  Vitals   Vitals:   Oct 30, 2023 2100 2023/10/30 2108 10-30-23 2145 10-30-23 2200  BP: (!) 236/68  (!) 174/71 (!) 222/62  Pulse: (!) 51  (!) 53 (!) 47  Resp: 12  17 17   Temp:  98.3 F (36.8 C)    TempSrc:  Oral    SpO2: 100%  100% 100%    There is no height or weight on file to calculate BMI.  Physical Exam   General: Laying comfortably in bed; in no acute distress.  HENT: Normal oropharynx and mucosa. Normal external appearance of ears and nose.  Neck: Supple, no pain or tenderness  CV: No JVD. No peripheral edema.  Pulmonary: Symmetric Chest rise. Normal respiratory effort.  Abdomen: Soft to touch, non-tender.  Ext: No cyanosis, edema, or deformity  Skin: No rash. Normal palpation of skin.    Musculoskeletal: Normal digits and nails by inspection. No clubbing.   Neurologic Examination  Mental status/Cognition: Alert, oriented to self, place, month and year, good attention.  Speech/language: slurred, fluent, comprehension intact, object naming intact, repetition intact.  Cranial nerves:   CN II Pupils equal and reactive to light, no VF deficits    CN III,IV,VI EOM intact, no gaze preference or deviation, no nystagmus    CN V normal sensation in V1, V2, and V3 segments bilaterally    CN VII L facial droop   CN VIII normal hearing to speech    CN IX & X normal palatal elevation, no uvular deviation    CN XI 5/5 head turn and 5/5 shoulder shrug bilaterally    CN XII midline tongue protrusion    Motor:  Muscle bulk: normal, tone normal, pronator drift noted in LUE Mvmt Root Nerve  Muscle Right Left Comments  SA C5/6 Ax Deltoid 5 4+   EF C5/6 Mc Biceps 5 4+   EE C6/7/8 Rad Triceps 5 4+   WF C6/7 Med FCR     WE C7/8 PIN ECU     F Ab C8/T1 U ADM/FDI 5 4+   HF L1/2/3 Fem Illopsoas 5 4+   KE L2/3/4 Fem Quad 5 4+   DF L4/5 D Peron Tib Ant 5 4+   PF S1/2 Tibial Grc/Sol 5 4+    Sensation:  Light touch Slightly decreased in L arm to touch   Pin prick    Temperature    Vibration   Proprioception    Coordination/Complex Motor:  - Finger to Nose intact BL - Heel to shin intact BL - Rapid alternating movement are slowed on hte left - Gait: deferred.   Labs/Imaging/Neurodiagnostic studies   CBC:  Recent Labs  Lab 2023-10-30 1726 Oct 30, 2023 1745  WBC 8.3  --   NEUTROABS 6.0  --   HGB 12.1 12.2  HCT 37.2 36.0  MCV 89.9  --   PLT 196  --    Basic Metabolic Panel:  Lab Results  Component Value Date   NA 140 Oct 30, 2023   K 3.6 2023-10-30   CO2 22 Oct 30, 2023   GLUCOSE 123 (H) 2023-10-30   BUN 11 October 30, 2023   CREATININE 0.70 2023-10-30   CALCIUM 9.0 10/30/23   GFRNONAA >60 30-Oct-2023   GFRAA >90 05/21/2014   Lipid Panel: No results found for: "LDLCALC" HgbA1c:  No results found for: "HGBA1C" Urine Drug Screen:     Component Value Date/Time  LABOPIA NONE DETECTED 10/05/2023 1937   COCAINSCRNUR NONE DETECTED 10/05/2023 1937   LABBENZ NONE DETECTED 10/05/2023 1937   AMPHETMU NONE DETECTED 10/05/2023 1937   THCU NONE DETECTED 10/05/2023 1937   LABBARB NONE DETECTED 10/05/2023 1937    Alcohol Level     Component Value Date/Time   ETH <15 10/05/2023 1726   INR  Lab Results  Component Value Date   INR 1.0 10/05/2023   APTT  Lab Results  Component Value Date   APTT 24 10/05/2023   AED levels: No results found for: "PHENYTOIN", "ZONISAMIDE", "LAMOTRIGINE", "LEVETIRACETA"  CT Head without contrast(Personally reviewed): CTH was negative for a large hypodensity concerning for a large territory infarct or hyperdensity concerning for an ICH  CT angio Head and Neck with contrast(Personally reviewed): No LVO  MRI Brain(Personally reviewed): R pontine stroke  ASSESSMENT   Pamela Gonzalez is a 73 y.o. female with hx of DM2 who was last seen at her baseline at 2200 and woke up with L arm and L leg weakness and L facial droop. She was found to have a R pontine stroke. She was outside tnkase window and has no LVO and therefore not a candidate for thrombectomy. Stroke is subtle and suspect likely small vessel.  She has several risk factors for stroke including DM2, HTN, daily smoker.  RECOMMENDATIONS  - Frequent Neuro checks per stroke unit protocol - Recommend obtaining TTE  - Recommend obtaining Lipid panel with LDL - Please start statin if LDL > 70 - Recommend HbA1c to evaluate for diabetes and how well it is controlled. - Antithrombotic - Aspirin 81mg  daily along with plavix 75mg  daily x 21days, followed by Aspirin 81mg  daily alone. - Recommend DVT ppx - SBP goal - permissive hypertension first 24 h < 220/110. Held home meds.  - Recommend Telemetry monitoring for arrythmia - Recommend bedside swallow screen prior to PO intake. -  Stroke education booklet - Recommend PT/OT/SLP consult  Plan discussed with EDP Dr. Tamela Fake. Plan also discussed with patient and her sister at the bedside. Counseled her on the importance of coming to the ED at the first sign of stroke. ______________________________________________________________________    Signed, Kyren Vaux, MD [facility]

## 2023-10-05 NOTE — ED Provider Notes (Signed)
 [place] [facility]MENT AT [facility] Provider Note   CSN: [id number] Arrival date & time: 10/05/23  1659     History  Chief Complaint  Patient presents with   Stroke Symptoms    Pamela Gonzalez is a 73 y.o. female.  HPI     73yo female with a history of diabetes who has not seen a physician in years, who presents with concern for left face, arm and leg weakness.  She reports that she has noted left leg weakness "for a while", but when her sister went to see her today noted that she had drooping of her face and had weakness of her arm.  She was last known normal at 2200 with the exception of leg weakness that may have been going on longer.    Denies chest pain, headaches, fever, nausea, vomiting.  Denies speech changes, vision changes.  No known hx of hypertension, has not seen physician and has been eating salty diet. Smokes  Past Medical History:  Diagnosis Date   Diabetes mellitus without complication (HCC)      Home Medications Prior to Admission medications   Medication Sig Start Date End Date Taking? Authorizing Provider  Tetrahydroz-Dextran-PEG-Povid 0.05-0.1-1-1 % SOLN Place 1 drop into both eyes daily as needed.   Yes [provider]      Allergies    Patient has no known allergies.    Review of Systems   Review of Systems  Physical Exam Updated Vital Signs BP (!) 174/136 (BP Location: Right Arm)   Pulse 66   Temp 98 F (36.7 C) (Oral)   Resp 14   SpO2 100%  Physical Exam Vitals and nursing note reviewed.  Constitutional:      General: She is not in acute distress.    Appearance: Normal appearance. She is well-developed. She is not ill-appearing or diaphoretic.  HENT:     Head: Normocephalic and atraumatic.  Eyes:     General: No visual field deficit.    Extraocular Movements: Extraocular movements intact.     Conjunctiva/sclera: Conjunctivae normal.     Pupils: Pupils are equal, round, and reactive to light.   Cardiovascular:     Rate and Rhythm: Normal rate and regular rhythm.     Pulses: Normal pulses.     Heart sounds: Normal heart sounds. No murmur heard.    No friction rub. No gallop.  Pulmonary:     Effort: Pulmonary effort is normal. No respiratory distress.     Breath sounds: Normal breath sounds. No wheezing or rales.  Abdominal:     General: There is no distension.     Palpations: Abdomen is soft.     Tenderness: There is no abdominal tenderness. There is no guarding.  Musculoskeletal:        General: No swelling or tenderness.     Cervical back: Normal range of motion.  Skin:    General: Skin is warm and dry.     Findings: No erythema or rash.  Neurological:     General: No focal deficit present.     Mental Status: She is alert and oriented to person, place, and time.     GCS: GCS eye subscore is 4. GCS verbal subscore is 5. GCS motor subscore is 6.     Cranial Nerves: Cranial nerve deficit (left facial droop) present. No dysarthria or facial asymmetry.     Sensory: No sensory deficit.     Motor: Weakness (left face, arm, leg) present.  No tremor.     Coordination: Coordination normal. Finger-Nose-Finger Test normal.     ED Results / Procedures / Treatments   Labs (all labs ordered are listed, but only abnormal results are displayed) Labs Reviewed  COMPREHENSIVE METABOLIC PANEL WITH GFR - Abnormal; Notable for the following components:      Result Value   Glucose, Bld 120 (*)    Total Protein 6.2 (*)    All other components within normal limits  LIPID PANEL - Abnormal; Notable for the following components:   Cholesterol 249 (*)    LDL Cholesterol 179 (*)    All other components within normal limits  BASIC METABOLIC PANEL WITH GFR - Abnormal; Notable for the following components:   Potassium 3.3 (*)    Glucose, Bld 148 (*)    All other components within normal limits  PHOSPHORUS - Abnormal; Notable for the following components:   Phosphorus 2.3 (*)    All other  components within normal limits  HEMOGLOBIN A1C - Abnormal; Notable for the following components:   Hgb A1c MFr Bld 7.1 (*)    All other components within normal limits  CBG MONITORING, ED - Abnormal; Notable for the following components:   Glucose-Capillary 121 (*)    All other components within normal limits  I-STAT CHEM 8, ED - Abnormal; Notable for the following components:   Glucose, Bld 123 (*)    All other components within normal limits  CBC WITH DIFFERENTIAL/PLATELET  ETHANOL  PROTIME-INR  APTT  RAPID URINE DRUG SCREEN, HOSP PERFORMED  CBC  MAGNESIUM  CBC  DIFFERENTIAL  I-STAT CHEM 8, ED    EKG EKG Interpretation Date/Time:  Monday October 05 2023 17:30:59 EDT Ventricular Rate:  54 PR Interval:  202 QRS Duration:  99 QT Interval:  459 QTC Calculation: 435 R Axis:   55  Text Interpretation: Sinus rhythm Multiple ventricular premature complexes Probable left ventricular hypertrophy Anterior Q waves, possibly due to LVH When compared with ECG of EARLIER SAME DATE Premature ventricular complexes are now present Confirmed by Alissa April ([id number]) on 10/05/2023 11:40:27 PM  Radiology MR Brain W and Wo Contrast Result Date: 10/05/2023 EXAM: MRI HEAD WITHOUT AND WITH CONTRAST TECHNIQUE: Multiplanar, multiecho pulse sequences of the brain and surrounding structures were obtained without and with intravenous contrast. CONTRAST:  6mL GADAVIST GADOBUTROL 1 MMOL/ML IV SOLN COMPARISON:  Same day CTA head/neck. FINDINGS: Brain: Mild restricted diffusion in the right pons, compatible with acute or early subacute infarct. Mild edema without mass effect. No evidence of acute hemorrhage, mass lesion or midline shift. Patchy T2/FLAIR hyperintensities in the white matter, compatible with chronic microvascular disease. Vascular: Major arterial flow voids are maintained. Skull and upper cervical spine: Normal marrow signal. Sinuses/Orbits: Clear sinuses.  No acute orbital findings. Other: No mastoid  effusions. IMPRESSION: Acute or early subacute infarct in the right pons. Electronically Signed   By: Stevenson Elbe M.D.   On: 10/05/2023 23:29   CT ANGIO HEAD NECK W WO CM Result Date: 10/05/2023 CLINICAL DATA:  Stroke/TIA, determine embolic source. EXAM: CT ANGIOGRAPHY HEAD AND NECK WITH AND WITHOUT CONTRAST TECHNIQUE: Multidetector CT imaging of the head and neck was performed using the standard protocol during bolus administration of intravenous contrast. Multiplanar CT image reconstructions and MIPs were obtained to evaluate the vascular anatomy. Carotid stenosis measurements (when applicable) are obtained utilizing NASCET criteria, using the distal internal carotid diameter as the denominator. RADIATION DOSE REDUCTION: This exam was performed according to the departmental dose-optimization program  which includes automated exposure control, adjustment of the mA and/or kV according to patient size and/or use of iterative reconstruction technique. CONTRAST:  75mL OMNIPAQUE IOHEXOL 350 MG/ML SOLN COMPARISON:  None Available. FINDINGS: CT HEAD FINDINGS Brain: No acute intracranial hemorrhage. No CT evidence of acute infarct. Nonspecific hypoattenuation in the periventricular and subcortical white matter favored to reflect chronic microvascular ischemic changes. Remote lacunar infarct involving the left caudate head. 0.5 cm partially calcified focus overlying the parasagittal left frontal lobe near the vertex suggestive of small meningioma. No edema, mass effect, or midline shift. The basilar cisterns are patent. Ventricles: Ventricles are normal in size and configuration. Vascular: No hyperdense vessel. Intracranial atherosclerotic calcifications noted. Skull: No acute or aggressive finding. Sinuses/orbits: The visualized paranasal sinuses are clear. Orbits are symmetric. Other: Mastoid air cells are clear. CTA NECK FINDINGS Aortic arch: Common origin of the brachiocephalic and left common carotid arteries.  Imaged portion shows no evidence of aneurysm or dissection. No significant stenosis of the major arch vessel origins. Pulmonary arteries: As permitted by contrast timing, there are no filling defects in the visualized pulmonary arteries. Subclavian arteries: The subclavian arteries are patent bilaterally. Right carotid system: No evidence of dissection, stenosis (50% or greater), or occlusion. Left carotid system: No evidence of dissection, stenosis (50% or greater), or occlusion. Vertebral arteries: Codominant. No evidence of dissection, stenosis (50% or greater), or occlusion. Skeleton: No acute findings. Degenerative changes in the cervical spine. Small cervical ribs bilaterally at C7. Dental caries. Other neck: The visualized airway is patent. No cervical lymphadenopathy. Upper chest: Visualized lung apices are clear. Review of the MIP images confirms the above findings CTA HEAD FINDINGS ANTERIOR CIRCULATION: The intracranial internal carotid arteries are patent. Mild atherosclerosis of the carotid siphons without high-grade stenosis. MCAs: The middle cerebral arteries are patent bilaterally. ACAs: The anterior cerebral arteries are patent bilaterally. POSTERIOR CIRCULATION: No significant stenosis, proximal occlusion, aneurysm, or vascular malformation. PCAs: The posterior cerebral arteries are patent bilaterally. Pcomm: Not well visualized. SCAs: The superior cerebellar arteries are patent bilaterally. Basilar artery: Patent AICAs: Not well visualized. PICAs: Patent Vertebral arteries: The intracranial vertebral arteries are patent. Venous sinuses: As permitted by contrast timing, patent. Anatomic variants: None Review of the MIP images confirms the above findings IMPRESSION: No large vessel occlusion. No high-grade stenosis, aneurysm, or dissection of the arteries in the head and neck. No CT evidence of acute intracranial abnormality. Remote lacunar infarct in the left caudate head. Small meningioma over the  parasagittal left frontal lobe. Electronically Signed   By: Denny Flack M.D.   On: 10/05/2023 21:36    Procedures Procedures    Medications Ordered in ED Medications   stroke: early stages of recovery book ( Does not apply Not Given 10/06/23 0931)  enoxaparin (LOVENOX) injection 40 mg (40 mg Subcutaneous Given 10/06/23 0931)  acetaminophen  (TYLENOL ) tablet 650 mg (has no administration in time range)  prochlorperazine (COMPAZINE) injection 5 mg (has no administration in time range)  polyethylene glycol (MIRALAX / GLYCOLAX) packet 17 g (has no administration in time range)  melatonin tablet 5 mg (has no administration in time range)  lactated ringers infusion ( Intravenous Infusion Verify 10/06/23 0748)  potassium chloride SA (KLOR-CON M) CR tablet 20 mEq (20 mEq Oral Given 10/06/23 0659)  potassium PHOSPHATE 30 mmol in dextrose 5 % 500 mL infusion (30 mmol Intravenous New Bag/Given 10/06/23 0748)  hydrALAZINE (APRESOLINE) injection 5 mg (has no administration in time range)  aspirin EC tablet 81 mg (81 mg  Oral Given 10/06/23 0659)  clopidogrel (PLAVIX) tablet 75 mg (75 mg Oral Given 10/06/23 0658)  atorvastatin (LIPITOR) tablet 80 mg (80 mg Oral Given 10/06/23 0659)  iohexol (OMNIPAQUE) 350 MG/ML injection 75 mL (75 mLs Intravenous Contrast Given 10/05/23 1824)  gadobutrol (GADAVIST) 1 MMOL/ML injection 6 mL (6 mLs Intravenous Contrast Given 10/05/23 2022)  hydrALAZINE (APRESOLINE) injection 5 mg (5 mg Intravenous Given 10/05/23 2336)  hydrALAZINE (APRESOLINE) injection 5 mg (5 mg Intravenous Given 10/06/23 0136)    ED Course/ Medical Decision Making/ A&P                                  73yo female with a history of diabetes who has not seen a physician in years, who presents with concern for left face, arm and leg weakness.  Differential diagnosis includes ICH, CVA, DVT, mass, HHS, electrolyte abnormalities.  On arrival to the emergency department she is out of the thrombolytic window, does not have  signs of LVO.  CTA was completed and personally evaluated interpreted by me and radiology shows no evidence of intracranial hemorrhage, no high-grade stenosis, aneurysm or dissection.  She does have a remote infarct, small meningioma.  Labs completed and personally eval and interpreted by me show no clinically significant electrolyte abnormalities or anemia.  MRI completed and evaluated by me, neurology and radiology shows acute or early subacute infarct in the right pons.  Neurology was consulted and she was admitted to the hospitalist for further care.       Final Clinical Impression(s) / ED Diagnoses Final diagnoses:  Cerebrovascular accident (CVA), unspecified mechanism (HCC)  Left-sided weakness    Rx / DC Orders ED Discharge Orders     None         Scarlette Currier, MD 10/06/23 [phone]

## 2023-10-06 ENCOUNTER — Other Ambulatory Visit (HOSPITAL_COMMUNITY)

## 2023-10-06 DIAGNOSIS — R29701 NIHSS score 1: Secondary | ICD-10-CM

## 2023-10-06 DIAGNOSIS — R2990 Unspecified symptoms and signs involving the nervous system: Secondary | ICD-10-CM | POA: Diagnosis not present

## 2023-10-06 DIAGNOSIS — I639 Cerebral infarction, unspecified: Secondary | ICD-10-CM | POA: Diagnosis present

## 2023-10-06 DIAGNOSIS — F17210 Nicotine dependence, cigarettes, uncomplicated: Secondary | ICD-10-CM | POA: Diagnosis not present

## 2023-10-06 DIAGNOSIS — I10 Essential (primary) hypertension: Secondary | ICD-10-CM | POA: Diagnosis not present

## 2023-10-06 DIAGNOSIS — E1151 Type 2 diabetes mellitus with diabetic peripheral angiopathy without gangrene: Secondary | ICD-10-CM | POA: Diagnosis not present

## 2023-10-06 DIAGNOSIS — E785 Hyperlipidemia, unspecified: Secondary | ICD-10-CM

## 2023-10-06 DIAGNOSIS — I6381 Other cerebral infarction due to occlusion or stenosis of small artery: Secondary | ICD-10-CM | POA: Diagnosis not present

## 2023-10-06 LAB — BASIC METABOLIC PANEL WITH GFR
Anion gap: 6 (ref 5–15)
BUN: 8 mg/dL (ref 8–23)
CO2: 23 mmol/L (ref 22–32)
Calcium: 8.9 mg/dL (ref 8.9–10.3)
Chloride: 110 mmol/L (ref 98–111)
Creatinine, Ser: 0.67 mg/dL (ref 0.44–1.00)
GFR, Estimated: >60 mL/min (ref >60)
Glucose, Bld: 148 mg/dL — ABNORMAL HIGH (ref 70–99)
Potassium: 3.3 mmol/L — ABNORMAL LOW (ref 3.5–5.1)
Sodium: 139 mmol/L (ref 135–145)

## 2023-10-06 LAB — LIPID PANEL
Cholesterol: 249 mg/dL — ABNORMAL HIGH (ref 0–200)
HDL: 44 mg/dL (ref >40)
LDL Cholesterol: 179 mg/dL — ABNORMAL HIGH (ref 0–99)
Total CHOL/HDL Ratio: 5.7 ratio
Triglycerides: 129 mg/dL (ref <150)
VLDL: 26 mg/dL (ref 0–40)

## 2023-10-06 LAB — CBC
HCT: 37.6 % (ref 36.0–46.0)
Hemoglobin: 12.4 g/dL (ref 12.0–15.0)
MCH: 29.3 pg (ref 26.0–34.0)
MCHC: 33 g/dL (ref 30.0–36.0)
MCV: 88.9 fL (ref 80.0–100.0)
Platelets: 198 10*3/uL (ref 150–400)
RBC: 4.23 MIL/uL (ref 3.87–5.11)
RDW: 12.2 % (ref 11.5–15.5)
WBC: 9.9 10*3/uL (ref 4.0–10.5)
nRBC: 0 % (ref 0.0–0.2)

## 2023-10-06 LAB — PHOSPHORUS: Phosphorus: 2.3 mg/dL — ABNORMAL LOW (ref 2.5–4.6)

## 2023-10-06 LAB — GLUCOSE, CAPILLARY
Glucose-Capillary: 189 mg/dL — ABNORMAL HIGH (ref 70–99)
Glucose-Capillary: 166 mg/dL — ABNORMAL HIGH (ref 70–99)

## 2023-10-06 LAB — MAGNESIUM: Magnesium: 2 mg/dL (ref 1.7–2.4)

## 2023-10-06 MED ORDER — ATORVASTATIN CALCIUM 80 MG PO TABS
80.0000 mg | ORAL_TABLET | Freq: Every day | ORAL | Status: DC
  Administered 2023-10-06 – 2023-10-07 (×2): 80 mg via ORAL
  Filled 2023-10-06: qty 2
  Filled 2023-10-06: qty 1

## 2023-10-06 MED ORDER — POTASSIUM PHOSPHATES 15 MMOLE/5ML IV SOLN
30.0000 mmol | Freq: Once | INTRAVENOUS | Status: AC
  Administered 2023-10-06: 30 mmol via INTRAVENOUS
  Filled 2023-10-06: qty 10

## 2023-10-06 MED ORDER — LABETALOL HCL 5 MG/ML IV SOLN: 5.0000 mg | INTRAVENOUS | Status: DC | PRN

## 2023-10-06 MED ORDER — NICOTINE 21 MG/24HR TD PT24
21.0000 mg | MEDICATED_PATCH | Freq: Every day | TRANSDERMAL | Status: DC
  Administered 2023-10-07: 21 mg via TRANSDERMAL
  Filled 2023-10-06: qty 1

## 2023-10-06 MED ORDER — ASPIRIN 81 MG PO TBEC
81.0000 mg | DELAYED_RELEASE_TABLET | Freq: Every day | ORAL | Status: DC
  Administered 2023-10-06 – 2023-10-07 (×2): 81 mg via ORAL
  Filled 2023-10-06 (×2): qty 1

## 2023-10-06 MED ORDER — HYDRALAZINE HCL 20 MG/ML IJ SOLN: 5.0000 mg | Freq: Four times a day (QID) | INTRAMUSCULAR | Status: DC | PRN

## 2023-10-06 MED ORDER — INSULIN ASPART 100 UNIT/ML IJ SOLN
0.0000 [IU] | Freq: Three times a day (TID) | INTRAMUSCULAR | Status: DC
  Administered 2023-10-07 (×2): 2 [IU] via SUBCUTANEOUS

## 2023-10-06 MED ORDER — POTASSIUM CHLORIDE CRYS ER 20 MEQ PO TBCR
20.0000 meq | EXTENDED_RELEASE_TABLET | Freq: Two times a day (BID) | ORAL | Status: AC
  Administered 2023-10-06 (×2): 20 meq via ORAL
  Filled 2023-10-06: qty 1

## 2023-10-06 MED ORDER — CLOPIDOGREL BISULFATE 75 MG PO TABS
75.0000 mg | ORAL_TABLET | Freq: Every day | ORAL | Status: DC
  Administered 2023-10-06 – 2023-10-07 (×2): 75 mg via ORAL
  Filled 2023-10-06 (×3): qty 1

## 2023-10-06 MED ORDER — HYDRALAZINE HCL 20 MG/ML IJ SOLN
5.0000 mg | INTRAMUSCULAR | Status: AC
  Administered 2023-10-06: 5 mg via INTRAVENOUS
  Filled 2023-10-06: qty 1

## 2023-10-06 NOTE — Progress Notes (Signed)
 Patient to 5W29 at this time.  Patient ambulatory from stretcher to bed with steady gait noted

## 2023-10-06 NOTE — Progress Notes (Addendum)
 STROKE TEAM PROGRESS NOTE    INTERIM HISTORY/SUBJECTIVE Patient presented with sudden onset of left facial droop and hemiparesis.  MRI scan shows right pontine lacunar infarct.  CT angiogram showed no large vessel stenosis or occlusion.  Hemoglobin A1c 7.1.  LDL cholesterol 171 mg percent. Patient has been somewhat hypertensive overnight.  She reports improvement in her left-sided weakness and states she is willing to stop smoking.  OBJECTIVE  CBC    Component Value Date/Time   WBC 9.9 10/06/2023 0503   RBC 4.23 10/06/2023 0503   HGB 12.4 10/06/2023 0503   HCT 37.6 10/06/2023 0503   PLT 198 10/06/2023 0503   MCV 88.9 10/06/2023 0503   MCH 29.3 10/06/2023 0503   MCHC 33.0 10/06/2023 0503   RDW 12.2 10/06/2023 0503   LYMPHSABS 1.6 10/05/2023 1726   MONOABS 0.5 10/05/2023 1726   EOSABS 0.1 10/05/2023 1726   BASOSABS 0.1 10/05/2023 1726    BMET    Component Value Date/Time   NA 139 10/06/2023 0503   K 3.3 (L) 10/06/2023 0503   CL 110 10/06/2023 0503   CO2 23 10/06/2023 0503   GLUCOSE 148 (H) 10/06/2023 0503   BUN 8 10/06/2023 0503   CREATININE 0.67 10/06/2023 0503   CALCIUM 8.9 10/06/2023 0503   GFRNONAA >60 10/06/2023 0503    IMAGING past 24 hours MR Brain W and Wo Contrast Result Date: 10/05/2023 EXAM: MRI HEAD WITHOUT AND WITH CONTRAST TECHNIQUE: Multiplanar, multiecho pulse sequences of the brain and surrounding structures were obtained without and with intravenous contrast. CONTRAST:  6mL GADAVIST GADOBUTROL 1 MMOL/ML IV SOLN COMPARISON:  Same day CTA head/neck. FINDINGS: Brain: Mild restricted diffusion in the right pons, compatible with acute or early subacute infarct. Mild edema without mass effect. No evidence of acute hemorrhage, mass lesion or midline shift. Patchy T2/FLAIR hyperintensities in the white matter, compatible with chronic microvascular disease. Vascular: Major arterial flow voids are maintained. Skull and upper cervical spine: Normal marrow signal.  Sinuses/Orbits: Clear sinuses.  No acute orbital findings. Other: No mastoid effusions. IMPRESSION: Acute or early subacute infarct in the right pons. Electronically Signed   By: Stevenson Elbe M.D.   On: 10/05/2023 23:29   CT ANGIO HEAD NECK W WO CM Result Date: 10/05/2023 CLINICAL DATA:  Stroke/TIA, determine embolic source. EXAM: CT ANGIOGRAPHY HEAD AND NECK WITH AND WITHOUT CONTRAST TECHNIQUE: Multidetector CT imaging of the head and neck was performed using the standard protocol during bolus administration of intravenous contrast. Multiplanar CT image reconstructions and MIPs were obtained to evaluate the vascular anatomy. Carotid stenosis measurements (when applicable) are obtained utilizing NASCET criteria, using the distal internal carotid diameter as the denominator. RADIATION DOSE REDUCTION: This exam was performed according to the departmental dose-optimization program which includes automated exposure control, adjustment of the mA and/or kV according to patient size and/or use of iterative reconstruction technique. CONTRAST:  75mL OMNIPAQUE IOHEXOL 350 MG/ML SOLN COMPARISON:  None Available. FINDINGS: CT HEAD FINDINGS Brain: No acute intracranial hemorrhage. No CT evidence of acute infarct. Nonspecific hypoattenuation in the periventricular and subcortical white matter favored to reflect chronic microvascular ischemic changes. Remote lacunar infarct involving the left caudate head. 0.5 cm partially calcified focus overlying the parasagittal left frontal lobe near the vertex suggestive of small meningioma. No edema, mass effect, or midline shift. The basilar cisterns are patent. Ventricles: Ventricles are normal in size and configuration. Vascular: No hyperdense vessel. Intracranial atherosclerotic calcifications noted. Skull: No acute or aggressive finding. Sinuses/orbits: The visualized paranasal sinuses are  clear. Orbits are symmetric. Other: Mastoid air cells are clear. CTA NECK FINDINGS Aortic  arch: Common origin of the brachiocephalic and left common carotid arteries. Imaged portion shows no evidence of aneurysm or dissection. No significant stenosis of the major arch vessel origins. Pulmonary arteries: As permitted by contrast timing, there are no filling defects in the visualized pulmonary arteries. Subclavian arteries: The subclavian arteries are patent bilaterally. Right carotid system: No evidence of dissection, stenosis (50% or greater), or occlusion. Left carotid system: No evidence of dissection, stenosis (50% or greater), or occlusion. Vertebral arteries: Codominant. No evidence of dissection, stenosis (50% or greater), or occlusion. Skeleton: No acute findings. Degenerative changes in the cervical spine. Small cervical ribs bilaterally at C7. Dental caries. Other neck: The visualized airway is patent. No cervical lymphadenopathy. Upper chest: Visualized lung apices are clear. Review of the MIP images confirms the above findings CTA HEAD FINDINGS ANTERIOR CIRCULATION: The intracranial internal carotid arteries are patent. Mild atherosclerosis of the carotid siphons without high-grade stenosis. MCAs: The middle cerebral arteries are patent bilaterally. ACAs: The anterior cerebral arteries are patent bilaterally. POSTERIOR CIRCULATION: No significant stenosis, proximal occlusion, aneurysm, or vascular malformation. PCAs: The posterior cerebral arteries are patent bilaterally. Pcomm: Not well visualized. SCAs: The superior cerebellar arteries are patent bilaterally. Basilar artery: Patent AICAs: Not well visualized. PICAs: Patent Vertebral arteries: The intracranial vertebral arteries are patent. Venous sinuses: As permitted by contrast timing, patent. Anatomic variants: None Review of the MIP images confirms the above findings IMPRESSION: No large vessel occlusion. No high-grade stenosis, aneurysm, or dissection of the arteries in the head and neck. No CT evidence of acute intracranial  abnormality. Remote lacunar infarct in the left caudate head. Small meningioma over the parasagittal left frontal lobe. Electronically Signed   By: Denny Flack M.D.   On: 10/05/2023 21:36    Vitals:   10/06/23 0746 10/06/23 0800 10/06/23 1030 10/06/23 1138  BP: (!) 174/136  112/71 (!) 186/62  Pulse: (!) 58 66 85 (!) 55  Resp: 15 14 (!) 21 19  Temp: 98 F (36.7 C)   97.8 F (36.6 C)  TempSrc: Oral   Oral  SpO2: 100% 100% 100% 100%     PHYSICAL EXAM General:  Alert, well-nourished, well-developed patient in no acute distress Psych:  Mood and affect appropriate for situation CV: Regular rate and rhythm on monitor Respiratory:  Regular, unlabored respirations on room air   NEURO:  Mental Status: AA&Ox3, patient is able to give clear and coherent history Speech/Language: speech is without dysarthria or aphasia.    Cranial Nerves:  II: PERRL. Visual fields full.  III, IV, VI: EOMI. Eyelids elevate symmetrically.  V: Sensation is intact to light touch and symmetrical to face.  VII: Subtle left facial droop VIII: hearing intact to voice. IX, X:  Phonation is normal.  XII: tongue is midline without fasciculations. Motor: Able to move all 4 extremities with good antigravity strength, diminished fine finger movements on the left, right arm orbits left Tone: is normal and bulk is normal Sensation- Intact to light touch bilaterally.   Coordination: FTN intact bilaterally Gait- deferred  Most Recent NIH  1a Level of Conscious.: 0 1b LOC Questions: 0 1c LOC Commands: 0 2 Best Gaze: 0 3 Visual: 0 4 Facial Palsy: 1 5a Motor Arm - left: 0.  Diminished fine finger movements on the left.  Orbits right over left upper extremity. 5b Motor Arm - Right: 0 6a Motor Leg - Left: 0 6b Motor Leg -  Right: 0 7 Limb Ataxia: 0 8 Sensory: 0 9 Best Language: 0 10 Dysarthria: 0 11 Extinct. and Inatten.: 0 TOTAL: 1   ASSESSMENT/PLAN  Pamela Gonzalez is a 73 y.o. female with history  of diabetes and smoking who has not seen a doctor in several years admitted for left-sided weakness and left facial droop.  Small right pontine stroke seen on MRI.  NIH on Admission 3  Acute Ischemic Infarct:  right pontine stroke Etiology: Small vessel disease CT head No acute abnormality.  CTA head & neck no LVO or hemodynamically significant stenosis MRI acute to early subacute infarct in the right pons 2D Echo pending LDL 179 HgbA1c 7.1 VTE prophylaxis -Lovenox No antithrombotic prior to admission, now on aspirin 81 mg daily and clopidogrel 75 mg daily for 3 weeks and then aspirin alone. Therapy recommendations:  Outpatient PT/OT/ST Disposition: pending   Hypertension Home meds: None Stable the high end Blood Pressure Goal: BP less than 220/110  Recommend close follow-up with PCP to establish appropriate antihypertensive regimen  Hyperlipidemia Home meds: None LDL 179, goal < 70 Add atorvastatin 80 mg daily Continue statin at discharge  Diabetes type II poorly controlled Home meds: None HgbA1c 7.1, goal < 7.0 CBGs SSI Recommend close follow-up with PCP for better DM control  Tobacco Abuse Patient smokes cigarettes       Ready to quit? Yes Nicotine replacement therapy provided  Other Stroke Risk Factors Advanced age   Other Active Problems None  Hospital day # 1  Patient seen by NP with MD, MD to edit note as needed. Pamela Gonzalez , MSN, AGACNP-BC [facility] See Amion for schedule and pager information 10/06/2023 12:14 PM   I have personally obtained history,examined this patient, reviewed notes, independently viewed imaging studies, participated in medical decision making and plan of care.ROS completed by me personally and pertinent positives fully documented  I have made any additions or clarifications directly to the above note. Agree with note above.  Patient presented with sudden onset of left hemiparesis and facial droop which appears  to be improving.  MRI scan shows small right pontine lacunar infarct.  Recommend aspirin and Plavix for 3 weeks followed by aspirin alone and aggressive risk factor modification.  Mobilize out of bed.  Therapy consults.  Check echocardiogram results.  May be discharged after echo results if safe to go home.  Follow-up as an outpatient stroke clinic in 2 months.  Greater than 50% time during this 50-minute visit was spent in counseling and coordination of care about her lacunar stroke and discussion with patient and care team and answering questions.  Ardella Beaver, MD Medical Director [facility] Pager: [phone] 10/06/2023 4:33 PM  To contact Stroke Continuity provider, please refer to [email or web address]. After hours, contact General Neurology

## 2023-10-06 NOTE — Progress Notes (Signed)
 Transition of Care [facility]) - Inpatient Brief Assessment   Patient Details  Name: Pamela Gonzalez MRN: [id number] Date of Birth: 02-05-1951  Transition of Care [facility]) CM/SW Contact:    Joanette Moynahan, RN Phone Number: 10/06/2023, 1:11 PM   Clinical Narrative:  Transition of Care Department [facility]) has reviewed patient and no TOC needs have been identified at this time. We will continue to monitor patient advancement through interdisciplinary progression rounds. If new patient transition needs arise, please place a TOC consult.     Transition of Care Asessment: Insurance and Status: (P) Insurance coverage has been reviewed Patient has primary care physician: Yes Home environment has been reviewed: from home with sister, brother and nephew Prior level of function:: independent Prior/Current Home Services: No current home services Social Drivers of Health Review: SDOH reviewed no interventions necessary Readmission risk has been reviewed: Yes Transition of care needs: no transition of care needs at this time

## 2023-10-06 NOTE — Evaluation (Signed)
 Physical Therapy Evaluation and sign off  Patient Details Name: BEN SANZ MRN: [id number] DOB: 17-Oct-1950 Today's Date: 10/06/2023  History of Present Illness  73 yo female presents to ED 6/2 with L weakness, severe HTN >220/120. CT angio head and neck was negative for LVO, showed remote lacunar infarct in L caudate head and small meningioma over the parasagittal L frontal lobe. MRI brain shows Acute or early subacute infarct in the R pons. PMH includes DMII, HTN, smoker 2 packs/day.  Clinical Impression   Pt presents with [facility] functional strength, balance scoring 22/24 on DGI not at increased risk of falls, and activity tolerance. Pt with min L weakness on MMT, but functionally pt with no signs of weakness with mobility. Pt ambulated good hallway distance without physical support, and tolerated challenge to dynamic balance well. PT reviewed s/s of stroke with BE FAST acronym, and explained the need to seek medical care as soon as symptoms develop in the future. Pt expresses understanding. Pt with no further acute or post-acute PT needs at this time. PT to sign off.       If plan is discharge home, recommend the following:     Can travel by private vehicle   Yes      Equipment Recommendations None recommended by PT  Recommendations for Other Services       Functional Status Assessment Patient has not had a recent decline in their functional status     Precautions / Restrictions Precautions Precautions: None Restrictions Weight Bearing Restrictions Per Provider Order: No      Mobility  Bed Mobility Overal bed mobility: Needs Assistance Bed Mobility: Supine to Sit, Sit to Supine     Supine to sit: Supervision Sit to supine: Supervision   General bed mobility comments: no physical assist    Transfers Overall transfer level: Modified independent                 General transfer comment: slow to rise, no physical assist    Ambulation/Gait Ambulation/Gait  assistance: Supervision Gait Distance (Feet): 250 Feet Assistive device: None Gait Pattern/deviations: Step-through pattern, WFL(Within Functional Limits) Gait velocity: wfl     General Gait Details: WFL with good step length LLE, no buckling or LLE instability noted even with challenge  Stairs            Wheelchair Mobility     Tilt Bed    Modified Rankin (Stroke Patients Only) Modified Rankin (Stroke Patients Only) Pre-Morbid Rankin Score: No significant disability Modified Rankin: No significant disability     Balance Overall balance assessment: Needs assistance Sitting-balance support: No upper extremity supported Sitting balance-Leahy Scale: Good     Standing balance support: No upper extremity supported, During functional activity Standing balance-Leahy Scale: Good                   Standardized Balance Assessment Standardized Balance Assessment : Dynamic Gait Index   Dynamic Gait Index Level Surface: Normal Change in Gait Speed: Mild Impairment Gait with Horizontal Head Turns: Normal Gait with Vertical Head Turns: Normal Gait and Pivot Turn: Normal Step Over Obstacle: Normal Step Around Obstacles: Normal Steps: Mild Impairment (anticipated) Total Score: 22       Pertinent Vitals/Pain Pain Assessment Pain Assessment: No/denies pain    Home Living Family/patient expects to be discharged to:: Private residence Living Arrangements: Other relatives (sister, brother, nephew) Available Help at Discharge: Family;Available 24 hours/day Type of Home: House Home Access: Stairs to enter Entrance Stairs-Rails: None  Entrance Stairs-Number of Steps: 3   Home Layout: One level Home Equipment: BSC/3in1      Prior Function Prior Level of Function : Independent/Modified Independent;Driving             Mobility Comments: retired from [employer]       Extremity/Trunk Assessment   Upper Extremity Assessment Upper Extremity  Assessment: Defer to OT evaluation    Lower Extremity Assessment Lower Extremity Assessment: LLE deficits/detail;Overall WFL for tasks assessed LLE Deficits / Details: 4/5 throughout, no functional weakness demonstrated during mobility.    Cervical / Trunk Assessment Cervical / Trunk Assessment: Normal  Communication   Communication Communication: No apparent difficulties    Cognition Arousal: Alert Behavior During Therapy: WFL for tasks assessed/performed   PT - Cognitive impairments: Attention, Problem solving                       PT - Cognition Comments: difficulty following commands for MMT Following commands: Impaired Following commands impaired: Follows one step commands with increased time, Follows multi-step commands inconsistently     Cueing Cueing Techniques: Verbal cues     General Comments General comments (skin integrity, edema, etc.): BP 201/61 post-gait, RN notified. HR 50s-60s    Exercises     Assessment/Plan    PT Assessment Patient does not need any further PT services  PT Problem List         PT Treatment Interventions      PT Goals (Current goals can be found in the Care Plan section)  Acute Rehab PT Goals PT Goal Formulation: All assessment and education complete, DC therapy Time For Goal Achievement: 10/06/23 Potential to Achieve Goals: Good    Frequency       Co-evaluation               AM-PAC PT "6 Clicks" Mobility  Outcome Measure Help needed turning from your back to your side while in a flat bed without using bedrails?: None Help needed moving from lying on your back to sitting on the side of a flat bed without using bedrails?: None Help needed moving to and from a bed to a chair (including a wheelchair)?: None Help needed standing up from a chair using your arms (e.g., wheelchair or bedside chair)?: None Help needed to walk in hospital room?: None Help needed climbing 3-5 steps with a railing? : A Little 6  Click Score: 23    End of Session Equipment Utilized During Treatment: Gait belt Activity Tolerance: Patient tolerated treatment well Patient left: in bed;with call bell/phone within reach Nurse Communication: Mobility status PT Visit Diagnosis: Other abnormalities of gait and mobility (R26.89);Muscle weakness (generalized) (M62.81)    Time: 1610-9604 PT Time Calculation (min) (ACUTE ONLY): 20 min   Charges:   PT Evaluation $PT Eval Low Complexity: 1 Low   PT General Charges $$ ACUTE PT VISIT: 1 Visit         Shirlene Doughty, PT DPT Acute Rehabilitation Services Secure Chat Preferred  Office [phone]   Ahliyah Nienow E Burnadette Carrion 10/06/2023, 11:34 AM

## 2023-10-06 NOTE — Progress Notes (Signed)
 Patient refusing to have telemetry monitor hooked up

## 2023-10-06 NOTE — ED Notes (Signed)
 5W called to notify pt transferring up.

## 2023-10-06 NOTE — Evaluation (Signed)
 Occupational Therapy Evaluation Patient Details Name: Pamela Gonzalez MRN: [id number] DOB: [birth year] Today's Date: 10/06/2023   History of Present Illness   73 yo female presents to ED 6/2 with L weakness, severe HTN >220/120. CT angio head and neck was negative for LVO, showed remote lacunar infarct in L caudate head and small meningioma over the parasagittal L frontal lobe. MRI brain shows Acute or early subacute infarct in the R pons. PMH includes DMII, HTN, smoker 2 packs/day.     Clinical Impressions Pt reports independent at baseline with ADL/functional mobility, lives with family who can assist at d/c. Pt currently needing set up - CGA for ADLs, supervision for bed mobility and CGA for transfers without AD. Pt with  noted cognitive deficits, difficulty recalling DOB, current date, declines participation in further cognitive tasks and often masking with humor. Pt presenting with impairments listed below, will follow acutely. Recommend OP OT at d/c.      If plan is discharge home, recommend the following:   A little help with walking and/or transfers;A little help with bathing/dressing/bathroom;Assistance with cooking/housework;Direct supervision/assist for medications management;Direct supervision/assist for financial management;Help with stairs or ramp for entrance;Assist for transportation;Supervision due to cognitive status     Functional Status Assessment   Patient has had a recent decline in their functional status and demonstrates the ability to make significant improvements in function in a reasonable and predictable amount of time.     Equipment Recommendations   None recommended by OT     Recommendations for Other Services   PT consult     Precautions/Restrictions   Precautions Precautions: None Restrictions Weight Bearing Restrictions Per Provider Order: No     Mobility Bed Mobility Overal bed mobility: Needs Assistance Bed Mobility: Supine to Sit,  Sit to Supine     Supine to sit: Supervision Sit to supine: Supervision        Transfers Overall transfer level: Needs assistance Equipment used: None Transfers: Sit to/from Stand Sit to Stand: Contact guard assist                  Balance Overall balance assessment: Mild deficits observed, not formally tested                                         ADL either performed or assessed with clinical judgement   ADL Overall ADL's : Needs assistance/impaired Eating/Feeding: Set up;Sitting   Grooming: Wash/dry hands;Standing   Upper Body Bathing: Contact guard assist   Lower Body Bathing: Contact guard assist   Upper Body Dressing : Contact guard assist   Lower Body Dressing: Contact guard assist   Toilet Transfer: Contact guard assist;Ambulation   Toileting- Clothing Manipulation and Hygiene: Supervision/safety       Functional mobility during ADLs: Contact guard assist       Vision   Vision Assessment?: No apparent visual deficits     Perception Perception: Not tested       Praxis Praxis: Not tested       Pertinent Vitals/Pain Pain Assessment Pain Assessment: No/denies pain     Extremity/Trunk Assessment Upper Extremity Assessment Upper Extremity Assessment: Defer to OT evaluation   Lower Extremity Assessment Lower Extremity Assessment: LLE deficits/detail;Overall WFL for tasks assessed LLE Deficits / Details: 4/5 throughout, no functional weakness demonstrated during mobility.   Cervical / Trunk Assessment Cervical / Trunk Assessment: Normal   Communication  Communication Communication: No apparent difficulties   Cognition Arousal: Alert Behavior During Therapy: WFL for tasks assessed/performed Cognition: Cognition impaired   Orientation impairments: Person (disoriented to DOB) Awareness: Intellectual awareness impaired Memory impairment (select all impairments): Short-term memory Attention impairment (select first  level of impairment): Sustained attention (needs cues to remain on task) Executive functioning impairment (select all impairments): Problem solving OT - Cognition Comments: pt often times using humor to mask cognitive deficits, incr time to state DOB and date cues needed from sister, aware she is at [facility], refuses to count backward or engage in Short Blessed Test, fidgeting with lines throughout                 Following commands: Impaired Following commands impaired: Follows one step commands with increased time     Cueing  General Comments   Cueing Techniques: Verbal cues  VSS   Exercises     Shoulder Instructions      Home Living Family/patient expects to be discharged to:: Private residence Living Arrangements: Other relatives (sister, brother, nephew) Available Help at Discharge: Family;Available 24 hours/day Type of Home: House Home Access: Stairs to enter [employer] of Steps: 3 Entrance Stairs-Rails: None Home Layout: One level     Bathroom Shower/Tub: Chief Strategy Officer: Standard     Home Equipment: BSC/3in1          Prior Functioning/Environment Prior Level of Function : Independent/Modified Independent;Driving             Mobility Comments: retired from [employer]      OT Problem List: Decreased activity tolerance;Impaired balance (sitting and/or standing);Decreased cognition   OT Treatment/Interventions: Self-care/ADL training;Therapeutic exercise;Energy conservation;DME and/or AE instruction;Therapeutic activities;Cognitive remediation/compensation;Visual/perceptual remediation/compensation;Patient/family education;Balance training      OT Goals(Current goals can be found in the care plan section)   Acute Rehab OT Goals Patient Stated Goal: to go home OT Goal Formulation: With patient Time For Goal Achievement: 10/20/23 Potential to Achieve Goals: Good   OT Frequency:  Min 2X/week     Co-evaluation              AM-PAC OT "6 Clicks" Daily Activity     Outcome Measure Help from another person eating meals?: A Little Help from another person taking care of personal grooming?: A Little Help from another person toileting, which includes using toliet, bedpan, or urinal?: A Little Help from another person bathing (including washing, rinsing, drying)?: A Little Help from another person to put on and taking off regular upper body clothing?: A Little Help from another person to put on and taking off regular lower body clothing?: A Little 6 Click Score: 18   End of Session Nurse Communication: Mobility status  Activity Tolerance: Patient tolerated treatment well Patient left: in bed;with call bell/phone within reach;with family/visitor present  OT Visit Diagnosis: Unsteadiness on feet (R26.81);Other abnormalities of gait and mobility (R26.89);Muscle weakness (generalized) (M62.81);Other symptoms and signs involving cognitive function                Time: 2956-2130 OT Time Calculation (min): 24 min Charges:  OT General Charges $OT Visit: 1 Visit OT Evaluation $OT Eval Low Complexity: 1 Low OT Treatments $Self Care/Home Management : 8-22 mins  Nailyn Dearinger K, OTD, OTR/L SecureChat Preferred Acute Rehab [phone]   Larron Armor K Koonce 10/06/2023, 11:30 AM

## 2023-10-06 NOTE — Evaluation (Signed)
 Speech Language Pathology Evaluation Patient Details Name: Pamela Gonzalez MRN: [id number] DOB: 1951-02-10 Today's Date: 10/06/2023 Time: 1520-1540 SLP Time Calculation (min) (ACUTE ONLY): 20 min  Problem List:  Patient Active Problem List   Diagnosis Date Noted   Acute CVA (cerebrovascular accident) (HCC) 10/06/2023   Stroke-like symptoms 10/05/2023   Past Medical History:  Past Medical History:  Diagnosis Date   Diabetes mellitus without complication (HCC)    Past Surgical History: History reviewed. No pertinent surgical history. HPI:  73 yo female presents to ED 6/2 with L weakness, severe HTN >220/120. CT angio head and neck was negative for LVO, showed remote lacunar infarct in L caudate head and small meningioma over the parasagittal L frontal lobe. MRI brain shows Acute or early subacute infarct in the R pons. PMH includes DMII, HTN, smoker 2 packs/day.   Assessment / Plan / Recommendation Clinical Impression  Patient was evaluated via the Cognistat to assess cognitive linguistic functioning. Patient with moderate-severe deficits in the realms of orientation to time, sustained attention, memory, problem solving and awareness. Patient with deficits in both short and long term memory characterized by was perseveration on "wanting to go home" and repeating information ~2-3 minutes throughout the session. Patient unable to provide basic biographical information including age and prior job. Patient attempting to hide cognitive deficits through joking and stating "you all think I am crazy."  Receptive and expressive language WFL. Patient with decreased awareness of deficits noting she could "go home and drive." SLP attempted to educate patient on safety risks and cognitive deficits. Recommend acute ST to aid in orientation, safety awareness, memory and problem solving.     SLP Assessment  SLP Recommendation/Assessment: Patient needs continued Speech Lanaguage Pathology Services SLP Visit  Diagnosis: Cognitive communication deficit (R41.841)    Recommendations for follow up therapy are one component of a multi-disciplinary discharge planning process, led by the attending physician.  Recommendations may be updated based on patient status, additional functional criteria and insurance authorization.    Follow Up Recommendations  Outpatient SLP    Assistance Recommended at Discharge  Intermittent Supervision/Assistance  Functional Status Assessment Patient has had a recent decline in their functional status and demonstrates the ability to make significant improvements in function in a reasonable and predictable amount of time.  Frequency and Duration min 2x/week  2 weeks      SLP Evaluation Cognition  Overall Cognitive Status: Impaired/Different from baseline Arousal/Alertness: Awake/alert Orientation Level: Oriented to person;Disoriented to time;Oriented to place;Oriented to situation Year: Other (Comment) (9562) Month: October Day of Week: Incorrect Attention: Sustained Sustained Attention: Impaired Sustained Attention Impairment: Verbal basic;Functional basic Memory: Impaired Memory Impairment: Decreased short term memory;Decreased long term memory Decreased Long Term Memory: Verbal basic;Functional basic Decreased Short Term Memory: Verbal basic;Functional basic Awareness: Impaired Awareness Impairment: Intellectual impairment;Emergent impairment Problem Solving: Impaired Problem Solving Impairment: Verbal basic;Functional basic Safety/Judgment: Impaired       Comprehension  Auditory Comprehension Overall Auditory Comprehension: Appears within functional limits for tasks assessed Yes/No Questions: Within Functional Limits Commands: Within Functional Limits    Expression Expression Primary Mode of Expression: Verbal Verbal Expression Overall Verbal Expression: Appears within functional limits for tasks assessed   Oral / Motor  Oral Motor/Sensory  Function Overall Oral Motor/Sensory Function: Within functional limits Motor Speech Overall Motor Speech: Appears within functional limits for tasks assessed            Blayke Cordrey M.A., CCC-SLP 10/06/2023, 3:49 PM

## 2023-10-06 NOTE — Care Management Obs Status (Signed)
 MEDICARE OBSERVATION STATUS NOTIFICATION   Patient Details  Name: Pamela Gonzalez MRN: [id number] Date of Birth: [birth year]   Medicare Observation Status Notification Given:  Yes    Joanette Moynahan, RN 10/06/2023, 12:57 PM

## 2023-10-06 NOTE — Progress Notes (Signed)
 Patient observed in hallway dressed in her clothes and carrying a bag of belongings attempting to leave unit.  Patient stated she did not want to stay here and was going home.  Patient successfully redirected to her room and reoriented to being in the hospital.  Patient's family contacted and someone will be coming to stay with her.  MD aware and has ordered sitter.

## 2023-10-06 NOTE — TOC CAGE-AID Note (Signed)
 Transition of Care [facility]) - CAGE-AID Screening   Patient Details  Name: LAKYIA BEHE MRN: [id number] Date of Birth: [birth year]  Transition of Care [facility]) CM/SW Contact:    Tiffiany Beadles E Tyriek Hofman, LCSW Phone Number: 10/06/2023, 11:01 AM   Clinical Narrative:    CAGE-AID Screening:    Have You Ever Felt You Ought to Cut Down on Your Drinking or Drug Use?: No Have People Annoyed You By [employer] Your Drinking Or Drug Use?: No Have You Felt Bad Or Guilty About Your Drinking Or Drug Use?: No Have You Ever Had a Drink or Used Drugs First Thing In The Morning to Steady Your Nerves or to Get Rid of a Hangover?: No CAGE-AID Score: 0  Substance Abuse Education Offered: No

## 2023-10-06 NOTE — Discharge Instructions (Signed)
 Dear Stevan Eke,   Congratulations for your interest in quitting smoking!  Find a program that suits you best: when you want to quit, how you need support, where you live, and how you like to learn.    If you're ready to get started TODAY, consider scheduling a visit through [email or web address].  Appointments are available from 8am to 8pm, Monday to Friday.   Most health insurance plans will cover some level of tobacco cessation visits and medications.    Additional Resources: [employer] are also available to help you quit & provide the support you'll need. Many programs are available in both [place] and Spanish and have a long history of successfully helping people get off and stay off tobacco.    Quit Smoking Apps:  quitSTART at [employer].de QuitGuide?at ForgetParking.dk Online education and resources: Smokefree  at [employer].gov Free Telephone Coaching: QuitNow,  Call 1-800-QUIT-NOW (601-178-0233) or Text- Ready to 304-380-7442 *Quitline [place] has teamed up with Medicaid to offer a free 14 week program    Vaping- Want to Quit? Free 24/7 support. Call [facility]  [place], [place], [place], [place], [place]  [place]   Follow with Primary MD Jearldine Mina, MD in 7 days   Get CBC, CMP, 2 view Chest X ray -  checked next visit with your primary MD  Activity: As tolerated with Full fall precautions use walker/cane & assistance as needed  Disposition Home   Diet: Heart Healthy low carbohydrate  Special Instructions: If you have smoked or chewed Tobacco  in the last 2 yrs please stop smoking, stop any regular Alcohol  and or any Recreational drug use.  On your next visit with your primary care physician please Get Medicines reviewed and adjusted.  Please request your Prim.MD to go over all Hospital Tests and Procedure/Radiological results at the follow up, please get all Hospital records sent  to your Prim MD by signing hospital release before you go home.  If you experience worsening of your admission symptoms, develop shortness of breath, life threatening emergency, suicidal or homicidal thoughts you must seek medical attention immediately by calling 911 or calling your MD immediately  if symptoms less severe.  You Must read complete instructions/literature along with all the possible adverse reactions/side effects for all the Medicines you take and that have been prescribed to you. Take any new Medicines after you have completely understood and accpet all the possible adverse reactions/side effects.   Do not drive when taking Pain medications.  Do not take more than prescribed Pain, Sleep and Anxiety Medications  Wear Seat belts while driving.

## 2023-10-06 NOTE — Care Management CC44 (Signed)
 Condition Code 44 Documentation Completed  Patient Details  Name: Pamela Gonzalez MRN: [id number] Date of Birth: 28-May-1950   Condition Code 44 given:  Yes Patient signature on Condition Code 44 notice:  Yes Documentation of 2 MD's agreement:  Yes Code 44 added to claim:  Yes    Joanette Moynahan, RN 10/06/2023, 12:57 PM

## 2023-10-06 NOTE — Progress Notes (Signed)
 Progress Note   Patient: Pamela Gonzalez WUJ:[id number] DOB: 06-Feb-1951 DOA: 10/05/2023     1 DOS: the patient was seen and examined on 10/06/2023   Brief hospital course: 73yo with h/o tobacco use, DM, and HTN not on medications who presented on 6/2 with left-sided weakness.  MRI + for acute infarct in R pons.  Assessment and Plan:  Acute CVA Presented with acute left-sided weakness Last known well 10/04/2023 around 2200, outside of window for TNK  MRI with acute infarct in the right pons Ongoing permissive hypertension Frequent neurochecks DAPT aspirin and Plavix x 21 days then aspirin alone High intensity statin PT/OT/speech therapy evaluation Follow-up 2D echo, fasting lipid panel, and hemoglobin A1c Fall and aspiration precautions Anticipate dc to home on 6/4 once evaluation is completed   Hypertension, BP is not at goal Ongoing permissive hypertension at this time, treat >220/120 Gradual normalization of BP within the next 2 days Needs outpatient BP management (has not been taking medications)   Hyperglycemia A1c 7.1, could do trial of diet and exercise with close outpatient f/u Will cover with moderate-scale SSI for now   Hyperlipidemia Goal LDL less than 70 Markedly elevated Has not been taking statin therapy Add Lipitor 80 mg daily for now   Tobacco abuse Smokes 2 packs/day Counseled on the importance of tobacco cessation Nicotine patch ordered   Hypokalemia and hypophosphatemia Repleted Recheck in AM       Consultants: Neurology PT OT SLP [facility] team  Procedures: Echocardiogram (pending)  Antibiotics: None     Subjective: L-sided weakness is improving and she would like to go home as soon as possible.  Has not been taking cholesterol medication because she preferred not to, is now willing.  Physical Exam: Vitals:   10/06/23 0121 10/06/23 0136 10/06/23 0345 10/06/23 0430  BP: (!) 224/65 (!) 224/65 (!) 201/59 (!) 193/65  Pulse: (!) 52  (!) 53  (!) 56  Resp: 20  16 (!) 22  Temp:      TempSrc:      SpO2: 100%  100% 100%     Intake/Output Summary (Last 24 hours) at 10/06/2023 0747 Last data filed at 10/05/2023 2239 Gross per 24 hour  Intake 0 ml  Output --  Net 0 ml   There were no vitals filed for this visit.  Exam:  General:  Appears calm and comfortable and is in NAD Eyes:  normal lids, iris ENT:  grossly normal hearing, lips & tongue, mmm Cardiovascular:  RRR. No LE edema.  Respiratory:   CTA bilaterally with no wheezes/rales/rhonchi.  Normal respiratory effort. Abdomen:  soft, NT, ND Skin:  no rash or induration seen on limited exam Musculoskeletal:  LUE > LLE subtle weakness Psychiatric:  grossly normal mood and affect, speech fluent and appropriate, AOx3 Neurologic: Left-sided facial droop (subtle), moves all extremities in coordinated fashion  Data Reviewed: I have reviewed the patient's lab results since admission.  Pertinent labs for today include:   K+ 3.3 Glucose 148 Phos 2.3 Lipids: 249/44/179/129 Normal CBC A1c 7.1    Family Communication: Sister was present throughout  Disposition: Status is: Observation It is my clinical opinion that referral for OBSERVATION is reasonable and necessary in this patient based on the above information provided. The aforementioned taken together are felt to place the patient at high risk for further clinical deterioration. However it is anticipated that the patient may be medically stable for discharge from the hospital within 24 to 48 hours.    Planned Discharge  Destination: Home    Time spent: 50 minutes  Author: Lorita Rosa, MD 10/06/2023 7:45 AM  For on call review [email or web address].

## 2023-10-07 ENCOUNTER — Observation Stay (HOSPITAL_BASED_OUTPATIENT_CLINIC_OR_DEPARTMENT_OTHER)

## 2023-10-07 ENCOUNTER — Other Ambulatory Visit (HOSPITAL_COMMUNITY): Payer: Self-pay

## 2023-10-07 DIAGNOSIS — F17210 Nicotine dependence, cigarettes, uncomplicated: Secondary | ICD-10-CM | POA: Diagnosis not present

## 2023-10-07 DIAGNOSIS — I6381 Other cerebral infarction due to occlusion or stenosis of small artery: Secondary | ICD-10-CM | POA: Diagnosis not present

## 2023-10-07 DIAGNOSIS — E1151 Type 2 diabetes mellitus with diabetic peripheral angiopathy without gangrene: Secondary | ICD-10-CM | POA: Diagnosis not present

## 2023-10-07 DIAGNOSIS — I10 Essential (primary) hypertension: Secondary | ICD-10-CM | POA: Diagnosis not present

## 2023-10-07 DIAGNOSIS — R2990 Unspecified symptoms and signs involving the nervous system: Secondary | ICD-10-CM | POA: Diagnosis not present

## 2023-10-07 DIAGNOSIS — E785 Hyperlipidemia, unspecified: Secondary | ICD-10-CM | POA: Diagnosis not present

## 2023-10-07 DIAGNOSIS — R29701 NIHSS score 1: Secondary | ICD-10-CM | POA: Diagnosis not present

## 2023-10-07 DIAGNOSIS — I6389 Other cerebral infarction: Secondary | ICD-10-CM | POA: Diagnosis not present

## 2023-10-07 LAB — BASIC METABOLIC PANEL WITH GFR
Anion gap: 9 (ref 5–15)
BUN: 10 mg/dL (ref 8–23)
CO2: 25 mmol/L (ref 22–32)
Calcium: 9.4 mg/dL (ref 8.9–10.3)
Chloride: 107 mmol/L (ref 98–111)
Creatinine, Ser: 0.73 mg/dL (ref 0.44–1.00)
GFR, Estimated: >60 mL/min (ref >60)
Glucose, Bld: 133 mg/dL — ABNORMAL HIGH (ref 70–99)
Potassium: 4 mmol/L (ref 3.5–5.1)
Sodium: 141 mmol/L (ref 135–145)

## 2023-10-07 LAB — GLUCOSE, CAPILLARY
Glucose-Capillary: 136 mg/dL — ABNORMAL HIGH (ref 70–99)
Glucose-Capillary: 122 mg/dL — ABNORMAL HIGH (ref 70–99)

## 2023-10-07 LAB — CBC WITH DIFFERENTIAL/PLATELET
Abs Immature Granulocytes: 0.02 10*3/uL (ref 0.00–0.07)
Basophils Absolute: 0 10*3/uL (ref 0.0–0.1)
Basophils Relative: 0 %
Eosinophils Absolute: 0.1 10*3/uL (ref 0.0–0.5)
Eosinophils Relative: 2 %
HCT: 36.8 % (ref 36.0–46.0)
Hemoglobin: 12.2 g/dL (ref 12.0–15.0)
Immature Granulocytes: 0 %
Lymphocytes Relative: 30 %
Lymphs Abs: 2.4 10*3/uL (ref 0.7–4.0)
MCH: 29.3 pg (ref 26.0–34.0)
MCHC: 33.2 g/dL (ref 30.0–36.0)
MCV: 88.2 fL (ref 80.0–100.0)
Monocytes Absolute: 0.6 10*3/uL (ref 0.1–1.0)
Monocytes Relative: 7 %
Neutro Abs: 4.9 10*3/uL (ref 1.7–7.7)
Neutrophils Relative %: 61 %
Platelets: 219 10*3/uL (ref 150–400)
RBC: 4.17 MIL/uL (ref 3.87–5.11)
RDW: 12.3 % (ref 11.5–15.5)
WBC: 8 10*3/uL (ref 4.0–10.5)
nRBC: 0 % (ref 0.0–0.2)

## 2023-10-07 LAB — ECHOCARDIOGRAM COMPLETE
Area-P 1/2: 3.37 cm2
S' Lateral: 3.3 cm

## 2023-10-07 LAB — PHOSPHORUS: Phosphorus: 3.1 mg/dL (ref 2.5–4.6)

## 2023-10-07 MED ORDER — CLOPIDOGREL BISULFATE 75 MG PO TABS
75.0000 mg | ORAL_TABLET | Freq: Every day | ORAL | 0 refills | Status: AC
  Filled 2023-10-07: qty 20, 20d supply, fill #0

## 2023-10-07 MED ORDER — NICOTINE 21 MG/24HR TD PT24
21.0000 mg | MEDICATED_PATCH | Freq: Every day | TRANSDERMAL | 0 refills | Status: AC
  Filled 2023-10-07: qty 28, 28d supply, fill #0

## 2023-10-07 MED ORDER — ASPIRIN 81 MG PO TBEC
81.0000 mg | DELAYED_RELEASE_TABLET | Freq: Every day | ORAL | 12 refills | Status: AC
  Filled 2023-10-07: qty 30, 30d supply, fill #0

## 2023-10-07 MED ORDER — METFORMIN HCL 500 MG PO TABS
500.0000 mg | ORAL_TABLET | Freq: Two times a day (BID) | ORAL | 0 refills | Status: AC
  Filled 2023-10-07: qty 60, 30d supply, fill #0

## 2023-10-07 MED ORDER — ATORVASTATIN CALCIUM 80 MG PO TABS
80.0000 mg | ORAL_TABLET | Freq: Every day | ORAL | 1 refills | Status: AC
  Filled 2023-10-07: qty 30, 30d supply, fill #0

## 2023-10-07 NOTE — Plan of Care (Signed)
 Pt has rested quietly throughout the night with no distress noted. Alert and oriented except to time. On room air. Refusing tele monitor. Pt ambulating in hallways per self ad lib. To BR ad lib. Sister at bedside. No complaints voiced.     Problem: Education: Goal: Knowledge of disease or condition will improve Outcome: Progressing   Problem: Ischemic Stroke/TIA Tissue Perfusion: Goal: Complications of ischemic stroke/TIA will be minimized Outcome: Progressing   Problem: Self-Care: Goal: Ability to communicate needs accurately will improve Outcome: Progressing   Problem: Clinical Measurements: Goal: Respiratory complications will improve Outcome: Progressing Goal: Cardiovascular complication will be avoided Outcome: Progressing   Problem: Pain Managment: Goal: General experience of comfort will improve and/or be controlled Outcome: Progressing

## 2023-10-07 NOTE — Progress Notes (Signed)
  2D Echocardiogram has been performed. Farley Honer, RDCS 10/07/2023, 3:15 PM

## 2023-10-07 NOTE — Discharge Summary (Addendum)
 NICKEY Gonzalez ZOX:[id number] DOB: 16-Jul-1950 DOA: 10/05/2023  PCP: Pamela Mina, MD  Admit date: 10/05/2023  Discharge date: 10/07/2023  Admitted From: Home   Disposition:  Home   Recommendations for Outpatient Follow-up:   Follow up with PCP in 1-2 weeks  PCP Please obtain BMP/CBC, 2 view CXR in 1week,  (see Discharge instructions)   PCP Please follow up on the following pending results:    Home Health: None   Equipment/Devices: None  Consultations: None  Discharge Condition: Stable    CODE STATUS: Full    Diet Recommendation: Heart Healthy Low Carb    Chief Complaint  Patient presents with   Stroke Symptoms     Brief history of present illness from the day of admission and additional interim summary    73 y.o. female with medical history significant for current tobacco user, 2 packs/day, type 2 diabetes, hypertension, not on any prescribed medications, who presents to the ER from home via EMS due to left arm weakness and left leg weakness.  Last known well was on 10/04/2023 around 2200.  Has not seen a medical provider in years.  She was having difficulty walking up the stairs and decided to come to the ER to be evaluated.  Workup consistent with stroke.                                                                 Hospital Course   Acute/subacute right pons infarct present on admission.  Full stroke workup done, seen by stroke team, LDL above goal, A1c above goal, placed on statin, Glucophage, DAPT for 3 weeks thereafter aspirin, symptoms have resolved she is back to baseline.  Cleared by PT-OT and speech.  She is completely symptom-free and eager to go home, postdischarge will follow-up with PCP and neurology.  DM type II and dyslipidemia.  New diagnosis.  Placed on Glucophage and statin.  PCP to  monitor.  Ongoing smoking.  Counseled to quit.  [name] patch for 3 weeks.   Discharge diagnosis     Principal Problem:   Stroke-like symptoms Active Problems:   Acute CVA (cerebrovascular accident) [facility])    Discharge instructions    Discharge Instructions     Discharge instructions   Complete by: As directed    Follow with Primary MD Pamela Mina, MD in 7 days   Get CBC, CMP, 2 view Chest X ray -  checked next visit with your primary MD  Activity: As tolerated with Full fall precautions use walker/cane & assistance as needed  Disposition Home   Diet: Heart Healthy Low Carb  Special Instructions: If you have smoked or chewed Tobacco  in the last 2 yrs please stop smoking, stop any regular Alcohol  and or any Recreational drug use.  On your next visit with  your primary care physician please Get Medicines reviewed and adjusted.  Please request your Prim.MD to go over all Hospital Tests and Procedure/Radiological results at the follow up, please get all Hospital records sent to your Prim MD by signing hospital release before you go home.  If you experience worsening of your admission symptoms, develop shortness of breath, life threatening emergency, suicidal or homicidal thoughts you must seek medical attention immediately by calling 911 or calling your MD immediately  if symptoms less severe.  You Must read complete instructions/literature along with all the possible adverse reactions/side effects for all the Medicines you take and that have been prescribed to you. Take any new Medicines after you have completely understood and accpet all the possible adverse reactions/side effects.   Do not drive when taking Pain medications.  Do not take more than prescribed Pain, Sleep and Anxiety Medications  Wear Seat belts while driving.   Increase activity slowly   Complete by: As directed        Discharge Medications   Allergies as of 10/07/2023   No Known Allergies       Medication List     TAKE these medications    aspirin EC 81 MG tablet Take 1 tablet (81 mg total) by mouth daily. Swallow whole. Start taking on: October 08, 2023   atorvastatin 80 MG tablet Commonly known as: LIPITOR Take 1 tablet (80 mg total) by mouth daily. Start taking on: October 08, 2023   clopidogrel 75 MG tablet Commonly known as: PLAVIX Take 1 tablet (75 mg total) by mouth daily. Start taking on: October 08, 2023   metFORMIN 500 MG tablet Commonly known as: GLUCOPHAGE Take 1 tablet (500 mg total) by mouth 2 (two) times daily with a meal.   nicotine 21 mg/24hr patch Commonly known as: [name] CQ - dosed in mg/24 hours Place 1 patch (21 mg total) onto the skin daily. Start taking on: October 08, 2023   Tetrahydroz-Dextran-PEG-Povid 0.05-0.1-1-1 % Soln Place 1 drop into both eyes daily as needed.         Follow-up Information     Pamela Mina, MD. Schedule an appointment as soon as possible for a visit in 1 week(s).   Specialty: Internal Medicine Contact information: [address] [place] [address] [phone]         [facility]. Schedule an appointment as soon as possible for a visit in 1 month(s).   Specialty: Neurology Contact information: [address] [place]  [phone]        [facility] Follow up.   Specialty: Rehabilitation Why: Please schedule appointment for outpatient speech therapy Contact information: [address] [place]  [phone]                Major procedures and Radiology Reports - PLEASE review detailed and final reports thoroughly  -       ECHOCARDIOGRAM COMPLETE Result Date: 10/07/2023    ECHOCARDIOGRAM REPORT   Patient Name:   Pamela Gonzalez Date of Exam: 10/07/2023 Medical Rec #:  [id number]         Height:       61.0 in Accession #:     [id number]        Weight:       140.0 lb Date of Birth:  July 13, 1950          BSA:  1.623 m Patient Age:    73 years          BP:           120/78 mmHg Patient Gender: F                 HR:           55 bpm. Exam Location:  Inpatient Procedure: 2D Echo (Both Spectral and Color Flow Doppler were utilized during            procedure). Indications:   CVA  History:       Patient has no prior history of Echocardiogram examinations.                Stroke.  Sonographer:   Dione Franks RDCS Referring      [id number] YATES Phys: IMPRESSIONS  1. Left ventricular ejection fraction, by estimation, is 55 to 60%. The left ventricle has normal function. The left ventricle has no regional wall motion abnormalities. Left ventricular diastolic parameters are consistent with Grade II diastolic dysfunction (pseudonormalization). Elevated left ventricular end-diastolic pressure.  2. Right ventricular systolic function is normal. The right ventricular size is normal.  3. Left atrial size was mildly dilated.  4. The mitral valve is abnormal. Trivial mitral valve regurgitation. No evidence of mitral stenosis.  5. The aortic valve is tricuspid. There is mild calcification of the aortic valve. There is mild thickening of the aortic valve. Aortic valve regurgitation is not visualized. Aortic valve sclerosis is present, with no evidence of aortic valve stenosis.  6. The inferior vena cava is normal in size with greater than 50% respiratory variability, suggesting right atrial pressure of 3 mmHg. FINDINGS  Left Ventricle: Left ventricular ejection fraction, by estimation, is 55 to 60%. The left ventricle has normal function. The left ventricle has no regional wall motion abnormalities. The left ventricular internal cavity size was normal in size. There is  no left ventricular hypertrophy. Left ventricular diastolic parameters are consistent with Grade II diastolic dysfunction (pseudonormalization). Elevated left ventricular  end-diastolic pressure. Right Ventricle: The right ventricular size is normal. No increase in right ventricular wall thickness. Right ventricular systolic function is normal. Left Atrium: Left atrial size was mildly dilated. Right Atrium: Right atrial size was normal in size. Pericardium: There is no evidence of pericardial effusion. Mitral Valve: The mitral valve is abnormal. There is mild thickening of the mitral valve leaflet(s). Trivial mitral valve regurgitation. No evidence of mitral valve stenosis. Tricuspid Valve: The tricuspid valve is normal in structure. Tricuspid valve regurgitation is not demonstrated. No evidence of tricuspid stenosis. Aortic Valve: The aortic valve is tricuspid. There is mild calcification of the aortic valve. There is mild thickening of the aortic valve. Aortic valve regurgitation is not visualized. Aortic valve sclerosis is present, with no evidence of aortic valve stenosis. Pulmonic Valve: The pulmonic valve was normal in structure. Pulmonic valve regurgitation is not visualized. No evidence of pulmonic stenosis. Aorta: The aortic root is normal in size and structure. Venous: The inferior vena cava is normal in size with greater than 50% respiratory variability, suggesting right atrial pressure of 3 mmHg. IAS/Shunts: No atrial level shunt detected by color flow Doppler. Additional Comments: 3D was performed not requiring image post processing on an independent workstation and was normal.  LEFT VENTRICLE PLAX 2D LVIDd:         4.70 cm   Diastology LVIDs:         3.30 cm   LV  e' medial:    5.78 cm/s LV PW:         0.90 cm   LV E/e' medial:  16.4 LV IVS:        0.90 cm   LV e' lateral:   6.53 cm/s LVOT diam:     1.60 cm   LV E/e' lateral: 14.5 LV SV:         67 LV SV Index:   41 LVOT Area:     2.01 cm                           3D Volume EF:                          3D EF:        56 %                          LV EDV:       135 ml                          LV ESV:       59 ml                           LV SV:        76 ml RIGHT VENTRICLE             IVC RV Basal diam:  3.70 cm     IVC diam: 1.30 cm RV S prime:     12.60 cm/s TAPSE (M-mode): 1.8 cm LEFT ATRIUM             Index        RIGHT ATRIUM           Index LA diam:        3.80 cm 2.34 cm/m   RA Area:     10.40 cm LA Vol (A2C):   50.9 ml 31.36 ml/m  RA Volume:   20.80 ml  12.82 ml/m LA Vol (A4C):   39.1 ml 24.09 ml/m LA Biplane Vol: 44.5 ml 27.42 ml/m  AORTIC VALVE LVOT Vmax:   137.00 cm/s LVOT Vmean:  86.600 cm/s LVOT VTI:    0.333 m  AORTA Ao Root diam: 2.80 cm Ao Asc diam:  2.90 cm MITRAL VALVE MV Area (PHT): 3.37 cm     SHUNTS MV Decel Time: 225 msec     Systemic VTI:  0.33 m MV E velocity: 94.80 cm/s   Systemic Diam: 1.60 cm MV A velocity: 101.00 cm/s MV E/A ratio:  0.94 Janelle Mediate MD Electronically signed by Janelle Mediate MD Signature Date/Time: 10/07/2023/3:31:28 PM    Final    MR Brain W and Wo Contrast Result Date: 10/05/2023 EXAM: MRI HEAD WITHOUT AND WITH CONTRAST TECHNIQUE: Multiplanar, multiecho pulse sequences of the brain and surrounding structures were obtained without and with intravenous contrast. CONTRAST:  6mL GADAVIST GADOBUTROL 1 MMOL/ML IV SOLN COMPARISON:  Same day CTA head/neck. FINDINGS: Brain: Mild restricted diffusion in the right pons, compatible with acute or early subacute infarct. Mild edema without mass effect. No evidence of acute hemorrhage, mass lesion or midline shift. Patchy T2/FLAIR hyperintensities in the white matter, compatible with chronic microvascular disease. Vascular: Major arterial flow voids are maintained. Skull and upper cervical spine: Normal marrow signal.  Sinuses/Orbits: Clear sinuses.  No acute orbital findings. Other: No mastoid effusions. IMPRESSION: Acute or early subacute infarct in the right pons. Electronically Signed   By: Stevenson Elbe M.D.   On: 10/05/2023 23:29   CT ANGIO HEAD NECK W WO CM Result Date: 10/05/2023 CLINICAL DATA:  Stroke/TIA, determine embolic source.  EXAM: CT ANGIOGRAPHY HEAD AND NECK WITH AND WITHOUT CONTRAST TECHNIQUE: Multidetector CT imaging of the head and neck was performed using the standard protocol during bolus administration of intravenous contrast. Multiplanar CT image reconstructions and MIPs were obtained to evaluate the vascular anatomy. Carotid stenosis measurements (when applicable) are obtained utilizing NASCET criteria, using the distal internal carotid diameter as the denominator. RADIATION DOSE REDUCTION: This exam was performed according to the departmental dose-optimization program which includes automated exposure control, adjustment of the mA and/or kV according to patient size and/or use of iterative reconstruction technique. CONTRAST:  75mL OMNIPAQUE IOHEXOL 350 MG/ML SOLN COMPARISON:  None Available. FINDINGS: CT HEAD FINDINGS Brain: No acute intracranial hemorrhage. No CT evidence of acute infarct. Nonspecific hypoattenuation in the periventricular and subcortical white matter favored to reflect chronic microvascular ischemic changes. Remote lacunar infarct involving the left caudate head. 0.5 cm partially calcified focus overlying the parasagittal left frontal lobe near the vertex suggestive of small meningioma. No edema, mass effect, or midline shift. The basilar cisterns are patent. Ventricles: Ventricles are normal in size and configuration. Vascular: No hyperdense vessel. Intracranial atherosclerotic calcifications noted. Skull: No acute or aggressive finding. Sinuses/orbits: The visualized paranasal sinuses are clear. Orbits are symmetric. Other: Mastoid air cells are clear. CTA NECK FINDINGS Aortic arch: Common origin of the brachiocephalic and left common carotid arteries. Imaged portion shows no evidence of aneurysm or dissection. No significant stenosis of the major arch vessel origins. Pulmonary arteries: As permitted by contrast timing, there are no filling defects in the visualized pulmonary arteries. Subclavian arteries:  The subclavian arteries are patent bilaterally. Right carotid system: No evidence of dissection, stenosis (50% or greater), or occlusion. Left carotid system: No evidence of dissection, stenosis (50% or greater), or occlusion. Vertebral arteries: Codominant. No evidence of dissection, stenosis (50% or greater), or occlusion. Skeleton: No acute findings. Degenerative changes in the cervical spine. Small cervical ribs bilaterally at C7. Dental caries. Other neck: The visualized airway is patent. No cervical lymphadenopathy. Upper chest: Visualized lung apices are clear. Review of the MIP images confirms the above findings CTA HEAD FINDINGS ANTERIOR CIRCULATION: The intracranial internal carotid arteries are patent. Mild atherosclerosis of the carotid siphons without high-grade stenosis. MCAs: The middle cerebral arteries are patent bilaterally. ACAs: The anterior cerebral arteries are patent bilaterally. POSTERIOR CIRCULATION: No significant stenosis, proximal occlusion, aneurysm, or vascular malformation. PCAs: The posterior cerebral arteries are patent bilaterally. Pcomm: Not well visualized. SCAs: The superior cerebellar arteries are patent bilaterally. Basilar artery: Patent AICAs: Not well visualized. PICAs: Patent Vertebral arteries: The intracranial vertebral arteries are patent. Venous sinuses: As permitted by contrast timing, patent. Anatomic variants: None Review of the MIP images confirms the above findings IMPRESSION: No large vessel occlusion. No high-grade stenosis, aneurysm, or dissection of the arteries in the head and neck. No CT evidence of acute intracranial abnormality. Remote lacunar infarct in the left caudate head. Small meningioma over the parasagittal left frontal lobe. Electronically Signed   By: Denny Flack M.D.   On: 10/05/2023 21:36    Micro Results     No results found for this or any previous visit (from the past 240 hours).  Today  Subjective    Pamela Gonzalez today has  no headache,no chest abdominal pain,no new weakness tingling or numbness, feels much better wants to go home today.     Objective   Blood pressure (!) 175/77, pulse (!) 55, temperature 98.2 F (36.8 C), temperature source Oral, resp. rate 16, SpO2 97%.  No intake or output data in the 24 hours ending 10/07/23 1539  Exam  Awake Alert, No new F.N deficits,    [place].AT,PERRAL Supple Neck,   Symmetrical Chest wall movement, Good air movement bilaterally, CTAB RRR,No Gallops,   +ve B.Sounds, Abd Soft, Non tender,  No Cyanosis, Clubbing or edema    Data Review   Recent Labs  Lab 10/05/23 1726 10/05/23 1745 10/06/23 0503 10/07/23 0400  WBC 8.3  --  9.9 8.0  HGB 12.1 12.2 12.4 12.2  HCT 37.2 36.0 37.6 36.8  PLT 196  --  198 219  MCV 89.9  --  88.9 88.2  MCH 29.2  --  29.3 29.3  MCHC 32.5  --  33.0 33.2  RDW 12.1  --  12.2 12.3  LYMPHSABS 1.6  --   --  2.4  MONOABS 0.5  --   --  0.6  EOSABS 0.1  --   --  0.1  BASOSABS 0.1  --   --  0.0    Recent Labs  Lab 10/05/23 1726 10/05/23 1745 10/06/23 0503 10/07/23 0400  NA 139 140 139 141  K 3.7 3.6 3.3* 4.0  CL 108 108 110 107  CO2 22  --  23 25  ANIONGAP 9  --  6 9  GLUCOSE 120* 123* 148* 133*  BUN 8 11 8 10   CREATININE 0.68 0.70 0.67 0.73  AST 17  --   --   --   ALT 24  --   --   --   ALKPHOS 56  --   --   --   BILITOT 0.8  --   --   --   ALBUMIN 3.7  --   --   --   INR 1.0  --   --   --   HGBA1C 7.1*  --   --   --   MG  --   --  2.0  --   PHOS  --   --  2.3* 3.1  CALCIUM 9.0  --  8.9 9.4    Total Time in preparing paper work, data evaluation and todays exam - 35 minutes  Signature  -    Lynnwood Sauer M.D on 10/07/2023 at 3:39 PM   -  To page go to [email or web address]

## 2023-10-07 NOTE — TOC Transition Note (Signed)
 Transition of Care [facility]) - Discharge Note   Patient Details  Name: FRANCESA EUGENIO MRN: [id number] Date of Birth: 1951-01-04  Transition of [facility]) CM/SW Contact:  Eusebio High, RN Phone Number: 10/07/2023, 4:03 PM   Clinical Narrative:     Patient will DC to home today OPSLP and OPOT have been referred and AVS is updated. No DME is needed Niece will transport to home           Patient Goals and CMS Choice            Discharge Placement                       Discharge Plan and Services Additional resources added to the After Visit Summary for                                       Social Drivers of Health (SDOH) Interventions SDOH Screenings   Food Insecurity: No Food Insecurity (10/07/2023)  Housing: Low Risk  (10/07/2023)  Transportation Needs: No Transportation Needs (10/07/2023)  Utilities: Not At Risk (10/07/2023)  Social Connections: Moderately Integrated (10/07/2023)  Tobacco Use: High Risk (10/05/2023)     Readmission Risk Interventions    10/06/2023    1:10 PM  Readmission Risk Prevention Plan  Medication Screening Complete  Transportation Screening Complete

## 2023-10-07 NOTE — Progress Notes (Signed)
 Patient alert ambulating in hall. Refuses tele monitor.

## 2023-10-07 NOTE — Progress Notes (Signed)
 Occupational Therapy Treatment Patient Details Name: Pamela Gonzalez MRN: [id number] DOB: [birth year] Today's Date: 10/07/2023   History of present illness 73 yo female presents to ED 6/2 with L weakness, severe HTN >220/120. CT angio head and neck was negative for LVO, showed remote lacunar infarct in L caudate head and small meningioma over the parasagittal L frontal lobe. MRI brain shows Acute or early subacute infarct in the R pons. PMH includes DMII, HTN, smoker 2 packs/day.   OT comments  Patient received up, walking in room fully dressed and patient's sister present. Pillbox test administered to patient and explained and instructions provided: Functional cognition further assessed with The Pillbox Test: A Measure of Executive Functioning and Estimate of Medication Management. A straight pass/fail designation is determined by 3 or more errors of omission or misplacement on the task. Pt had a total of 20 errors.and failed the assessment, demonstrating deficits with planning, mental flexibility, suboptimal search strategies, concrete thinking and difficulty with multitasking.  Errors: One tablet 3x/day - 3 errors (omission/misplacement) One tablet 2x/day with breakfast and dinner - 6 errors (omission/misplacement) One tablet in the morning - 4 errors (omission/misplacement) One tablet daily at bedtime  - 3 errors(omission/misplacement) One tablet every other day - 4 errors (omission/misplacement)    Total time to complete task  - 8 min  COTA discussed with patient's sister to assist with all medication to prevent errors. Patient able to demonstrate simulated tub transfer with supervision. Discharger recommendations continue to be appropriate. Acute OT to continue to follow.       If plan is discharge home, recommend the following:  A little help with walking and/or transfers;A little help with bathing/dressing/bathroom;Assistance with cooking/housework;Direct supervision/assist for  medications management;Direct supervision/assist for financial management;Help with stairs or ramp for entrance;Assist for transportation;Supervision due to cognitive status   Equipment Recommendations  None recommended by OT    Recommendations for Other Services      Precautions / Restrictions Precautions Precautions: None Restrictions Weight Bearing Restrictions Per Provider Order: No       Mobility Bed Mobility Overal bed mobility: Needs Assistance             General bed mobility comments: OOB    Transfers Overall transfer level: Modified independent                 General transfer comment: ambulating in room independently     Balance Overall balance assessment: Needs assistance Sitting-balance support: No upper extremity supported Sitting balance-Leahy Scale: Good     Standing balance support: No upper extremity supported, During functional activity Standing balance-Leahy Scale: Good                             ADL either performed or assessed with clinical judgement   ADL Overall ADL's : Needs assistance/impaired                                 Tub/ Shower Transfer: Therapist, sports Details (indicate cue type and reason): simulated tub transfer   General ADL Comments: focused on cognition, Pill box test    Extremity/Trunk Assessment              Vision       Perception     Praxis     Communication Communication Communication: No apparent difficulties   Cognition Arousal: Alert Behavior During Therapy: [facility] for  tasks assessed/performed Cognition: Cognition impaired   Orientation impairments: Time, Place Awareness: Intellectual awareness impaired Memory impairment (select all impairments): Short-term memory Attention impairment (select first level of impairment): Sustained attention Executive functioning impairment (select all impairments): Problem solving OT - Cognition Comments:  Performed pillbox test with multiple errors.                 Following commands: Impaired Following commands impaired: Follows one step commands with increased time, Follows multi-step commands inconsistently      Cueing   Cueing Techniques: Verbal cues  Exercises      Shoulder Instructions       General Comments failed pill box test. Sister present and lives with patient. Discussed sister managing medications to prevent errors    Pertinent Vitals/ Pain       Pain Assessment Pain Assessment: No/denies pain  Home Living                                          Prior Functioning/Environment              Frequency  Min 2X/week        Progress Toward Goals  OT Goals(current goals can now be found in the care plan section)  Progress towards OT goals: Progressing toward goals  Acute Rehab OT Goals Patient Stated Goal: to go home OT Goal Formulation: With patient Time For Goal Achievement: 10/20/23 Potential to Achieve Goals: Good ADL Goals Pt Will Perform Tub/Shower Transfer: Independently;ambulating Additional ADL Goal #1: pt will recieve passing score in pillbox assessment in prep for IADLs  Plan      Co-evaluation                 AM-PAC OT "6 Clicks" Daily Activity     Outcome Measure   Help from another person eating meals?: None Help from another person taking care of personal grooming?: A Little Help from another person toileting, which includes using toliet, bedpan, or urinal?: A Little Help from another person bathing (including washing, rinsing, drying)?: A Little Help from another person to put on and taking off regular upper body clothing?: A Little Help from another person to put on and taking off regular lower body clothing?: A Little 6 Click Score: 19    End of Session    OT Visit Diagnosis: Unsteadiness on feet (R26.81);Other abnormalities of gait and mobility (R26.89);Muscle weakness (generalized)  (M62.81);Other symptoms and signs involving cognitive function   Activity Tolerance Patient tolerated treatment well   Patient Left in bed;with call bell/phone within reach;with family/visitor present (EOB)   Nurse Communication Mobility status        Time: 3329-5188 OT Time Calculation (min): 27 min  Charges: OT General Charges $OT Visit: 1 Visit OT Treatments $Therapeutic Activity: 23-37 mins  Anitra Barn, OTA Acute Rehabilitation Services  Office [phone]   Jovita Nipper 10/07/2023, 8:01 AM

## 2023-10-07 NOTE — Progress Notes (Signed)
 STROKE TEAM PROGRESS NOTE    INTERIM HISTORY/SUBJECTIVE Patient sitting up comfortably in bed.  She has no complaints.  She reports improvement in her left-sided weakness and states she is willing to stop smoking.  OBJECTIVE  CBC    Component Value Date/Time   WBC 8.0 10/07/2023 0400   RBC 4.17 10/07/2023 0400   HGB 12.2 10/07/2023 0400   HCT 36.8 10/07/2023 0400   PLT 219 10/07/2023 0400   MCV 88.2 10/07/2023 0400   MCH 29.3 10/07/2023 0400   MCHC 33.2 10/07/2023 0400   RDW 12.3 10/07/2023 0400   LYMPHSABS 2.4 10/07/2023 0400   MONOABS 0.6 10/07/2023 0400   EOSABS 0.1 10/07/2023 0400   BASOSABS 0.0 10/07/2023 0400    BMET    Component Value Date/Time   NA 141 10/07/2023 0400   K 4.0 10/07/2023 0400   CL 107 10/07/2023 0400   CO2 25 10/07/2023 0400   GLUCOSE 133 (H) 10/07/2023 0400   BUN 10 10/07/2023 0400   CREATININE 0.73 10/07/2023 0400   CALCIUM 9.4 10/07/2023 0400   GFRNONAA >60 10/07/2023 0400    IMAGING past 24 hours No results found.   Vitals:   10/06/23 1138 10/06/23 1551 10/06/23 2027 10/07/23 0805  BP: (!) 186/62 (!) 191/59 132/87 (!) 175/77  Pulse: (!) 55 (!) 56 60 (!) 55  Resp: 19 19 16 16   Temp: 97.8 F (36.6 C) 98.8 F (37.1 C) 98.2 F (36.8 C) 98.2 F (36.8 C)  TempSrc: Oral Oral Oral Oral  SpO2: 100%  100% 97%     PHYSICAL EXAM General:  Alert, well-nourished, well-developed patient in no acute distress Psych:  Mood and affect appropriate for situation CV: Regular rate and rhythm on monitor Respiratory:  Regular, unlabored respirations on room air   NEURO:  Mental Status: AA&Ox3, patient is able to give clear and coherent history Speech/Language: speech is without dysarthria or aphasia.    Cranial Nerves:  II: PERRL. Visual fields full.  III, IV, VI: EOMI. Eyelids elevate symmetrically.  V: Sensation is intact to light touch and symmetrical to face.  VII: Subtle left facial droop VIII: hearing intact to voice. IX, X:   Phonation is normal.  XII: tongue is midline without fasciculations. Motor: Able to move all 4 extremities with good antigravity strength, diminished fine finger movements on the left, right arm orbits left Tone: is normal and bulk is normal Sensation- Intact to light touch bilaterally.   Coordination: FTN intact bilaterally Gait- deferred  Most Recent NIH  1a Level of Conscious.: 0 1b LOC Questions: 0 1c LOC Commands: 0 2 Best Gaze: 0 3 Visual: 0 4 Facial Palsy: 1 5a Motor Arm - left: 0.  Diminished fine finger movements on the left.  Orbits right over left upper extremity. 5b Motor Arm - Right: 0 6a Motor Leg - Left: 0 6b Motor Leg - Right: 0 7 Limb Ataxia: 0 8 Sensory: 0 9 Best Language: 0 10 Dysarthria: 0 11 Extinct. and Inatten.: 0 TOTAL: 1   ASSESSMENT/PLAN  Pamela Gonzalez is a 73 y.o. female with history of diabetes and smoking who has not seen a doctor in several years admitted for left-sided weakness and left facial droop.  Small right pontine stroke seen on MRI.  NIH on Admission 3  Acute Ischemic Infarct:  right pontine stroke Etiology: Small vessel disease CT head No acute abnormality.  CTA head & neck no LVO or hemodynamically significant stenosis MRI acute to early subacute infarct in  the right pons 2D Echo pending LDL 179 HgbA1c 7.1 VTE prophylaxis -Lovenox No antithrombotic prior to admission, now on aspirin 81 mg daily and clopidogrel 75 mg daily for 3 weeks and then aspirin alone. Therapy recommendations:  Outpatient PT/OT/ST Disposition: pending   Hypertension Home meds: None Stable the high end Blood Pressure Goal: BP less than 220/110  Recommend close follow-up with PCP to establish appropriate antihypertensive regimen  Hyperlipidemia Home meds: None LDL 179, goal < 70 Add atorvastatin 80 mg daily Continue statin at discharge  Diabetes type II poorly controlled Home meds: None HgbA1c 7.1, goal < 7.0 CBGs SSI Recommend close  follow-up with PCP for better DM control  Tobacco Abuse Patient smokes cigarettes       Ready to quit? Yes Nicotine replacement therapy provided  Other Stroke Risk Factors Advanced age   Other Active Problems None     Patient presented with sudden onset of left hemiparesis and facial droop which appears to be improving.  MRI scan shows small right pontine lacunar infarct.  Recommend aspirin and Plavix for 3 weeks followed by aspirin alone and aggressive risk factor modification.  Mobilize out of bed.  Therapy consults.  Check echocardiogram results.  May be discharged after echo results if safe to go home.  Follow-up as an outpatient stroke clinic in 2 months.  Greater than 50% time during this 35-minute visit was spent in counseling and coordination of care about her lacunar stroke and discussion with patient and care team and answering questions. Stroke team will sign off.  Kindly call for questions.  Discussed with Dr. Carmencita Chou, MD Medical Director [facility] Pager: [phone] 10/07/2023 3:03 PM  To contact Stroke Continuity provider, please refer to [email or web address]. After hours, contact General Neurology

## 2023-10-12 DIAGNOSIS — Z09 Encounter for follow-up examination after completed treatment for conditions other than malignant neoplasm: Secondary | ICD-10-CM | POA: Diagnosis not present

## 2023-10-12 DIAGNOSIS — R0602 Shortness of breath: Secondary | ICD-10-CM | POA: Diagnosis not present

## 2023-10-12 DIAGNOSIS — E7800 Pure hypercholesterolemia, unspecified: Secondary | ICD-10-CM | POA: Diagnosis not present

## 2023-10-12 DIAGNOSIS — I639 Cerebral infarction, unspecified: Secondary | ICD-10-CM | POA: Diagnosis not present

## 2023-10-12 DIAGNOSIS — I10 Essential (primary) hypertension: Secondary | ICD-10-CM | POA: Diagnosis not present

## 2023-10-12 DIAGNOSIS — Z79899 Other long term (current) drug therapy: Secondary | ICD-10-CM | POA: Diagnosis not present

## 2023-10-12 DIAGNOSIS — E119 Type 2 diabetes mellitus without complications: Secondary | ICD-10-CM | POA: Diagnosis not present

## 2023-10-12 DIAGNOSIS — Z1159 Encounter for screening for other viral diseases: Secondary | ICD-10-CM | POA: Diagnosis not present

## 2023-10-12 DIAGNOSIS — R5383 Other fatigue: Secondary | ICD-10-CM | POA: Diagnosis not present

## 2023-10-21 ENCOUNTER — Ambulatory Visit

## 2023-11-23 ENCOUNTER — Ambulatory Visit: Attending: Internal Medicine
